# Patient Record
Sex: Female | Born: 1985 | Race: White | Hispanic: No | Marital: Married | Smoking: Current every day smoker
Health system: Southern US, Community
[De-identification: ages and names within clinical notes are randomized; demographics above are authoritative.]

## PROBLEM LIST (undated history)

## (undated) DIAGNOSIS — F200 Paranoid schizophrenia: Secondary | ICD-10-CM

## (undated) DIAGNOSIS — F329 Major depressive disorder, single episode, unspecified: Secondary | ICD-10-CM

## (undated) DIAGNOSIS — R011 Cardiac murmur, unspecified: Secondary | ICD-10-CM

## (undated) DIAGNOSIS — F319 Bipolar disorder, unspecified: Secondary | ICD-10-CM

## (undated) DIAGNOSIS — F419 Anxiety disorder, unspecified: Secondary | ICD-10-CM

## (undated) DIAGNOSIS — L0591 Pilonidal cyst without abscess: Secondary | ICD-10-CM

## (undated) DIAGNOSIS — F32A Depression, unspecified: Secondary | ICD-10-CM

## (undated) HISTORY — DX: Bipolar disorder, unspecified: F31.9

## (undated) HISTORY — DX: Depression, unspecified: F32.A

## (undated) HISTORY — DX: Major depressive disorder, single episode, unspecified: F32.9

## (undated) HISTORY — DX: Paranoid schizophrenia: F20.0

## (undated) HISTORY — DX: Anxiety disorder, unspecified: F41.9

## (undated) HISTORY — DX: Cardiac murmur, unspecified: R01.1

---

## 2004-01-30 ENCOUNTER — Other Ambulatory Visit: Payer: Self-pay

## 2005-11-21 ENCOUNTER — Emergency Department: Payer: Self-pay | Admitting: Emergency Medicine

## 2005-12-09 ENCOUNTER — Emergency Department: Payer: Self-pay | Admitting: Internal Medicine

## 2005-12-13 ENCOUNTER — Emergency Department: Payer: Self-pay | Admitting: Unknown Physician Specialty

## 2006-04-14 ENCOUNTER — Observation Stay: Payer: Self-pay | Admitting: Obstetrics & Gynecology

## 2006-08-28 ENCOUNTER — Emergency Department: Payer: Self-pay | Admitting: General Practice

## 2007-06-21 ENCOUNTER — Emergency Department: Payer: Self-pay | Admitting: Emergency Medicine

## 2011-11-08 ENCOUNTER — Emergency Department: Payer: Self-pay | Admitting: Emergency Medicine

## 2011-11-15 ENCOUNTER — Emergency Department: Payer: Self-pay | Admitting: Emergency Medicine

## 2011-11-15 LAB — HCG, QUANTITATIVE, PREGNANCY: Beta Hcg, Quant.: <1 m[IU]/mL — ABNORMAL LOW

## 2011-11-15 LAB — CBC
HGB: 13.3 g/dL (ref 12.0–16.0)
MCH: 29.8 pg (ref 26.0–34.0)
MCV: 88 fL (ref 80–100)
Platelet: 299 10*3/uL (ref 150–440)
RBC: 4.45 10*6/uL (ref 3.80–5.20)
WBC: 10 10*3/uL (ref 3.6–11.0)

## 2011-11-15 LAB — COMPREHENSIVE METABOLIC PANEL
Albumin: 3.5 g/dL (ref 3.4–5.0)
Anion Gap: 10 (ref 7–16)
BUN: 11 mg/dL (ref 7–18)
Bilirubin,Total: 0.2 mg/dL (ref 0.2–1.0)
Chloride: 103 mmol/L (ref 98–107)
Creatinine: 0.77 mg/dL (ref 0.60–1.30)
Glucose: 91 mg/dL (ref 65–99)
Osmolality: 280 (ref 275–301)
Potassium: 3.8 mmol/L (ref 3.5–5.1)
SGOT(AST): 28 U/L (ref 15–37)
SGPT (ALT): 22 U/L
Total Protein: 8.1 g/dL (ref 6.4–8.2)

## 2011-11-17 ENCOUNTER — Emergency Department: Payer: Self-pay | Admitting: Emergency Medicine

## 2012-06-30 ENCOUNTER — Emergency Department: Payer: Self-pay | Admitting: Unknown Physician Specialty

## 2012-06-30 LAB — URINALYSIS, COMPLETE
Bacteria: NONE SEEN
Bilirubin,UR: NEGATIVE
Glucose,UR: NEGATIVE mg/dL (ref 0–75)
Nitrite: NEGATIVE
Protein: NEGATIVE

## 2012-06-30 LAB — WET PREP, GENITAL

## 2012-11-11 ENCOUNTER — Emergency Department: Payer: Self-pay | Admitting: Emergency Medicine

## 2012-11-11 LAB — COMPREHENSIVE METABOLIC PANEL
Albumin: 3.3 g/dL — ABNORMAL LOW (ref 3.4–5.0)
Alkaline Phosphatase: 76 U/L (ref 50–136)
Bilirubin,Total: 0.5 mg/dL (ref 0.2–1.0)
Calcium, Total: 8.8 mg/dL (ref 8.5–10.1)
Chloride: 103 mmol/L (ref 98–107)
Co2: 29 mmol/L (ref 21–32)
Creatinine: 0.76 mg/dL (ref 0.60–1.30)
EGFR (African American): >60
Glucose: 88 mg/dL (ref 65–99)
Osmolality: 272 (ref 275–301)
Potassium: 3.6 mmol/L (ref 3.5–5.1)
SGPT (ALT): 29 U/L (ref 12–78)
Total Protein: 8.3 g/dL — ABNORMAL HIGH (ref 6.4–8.2)

## 2012-11-11 LAB — CBC WITH DIFFERENTIAL/PLATELET
Eosinophil %: 0.2 %
HCT: 40 % (ref 35.0–47.0)
HGB: 13.4 g/dL (ref 12.0–16.0)
Lymphocyte #: 2.5 10*3/uL (ref 1.0–3.6)
Lymphocyte %: 16.5 %
MCH: 29.4 pg (ref 26.0–34.0)
MCHC: 33.6 g/dL (ref 32.0–36.0)
Monocyte %: 6.4 %
Neutrophil #: 11.6 10*3/uL — ABNORMAL HIGH (ref 1.4–6.5)
Neutrophil %: 75.9 %
Platelet: 314 10*3/uL (ref 150–440)
RBC: 4.57 10*6/uL (ref 3.80–5.20)
RDW: 13.6 % (ref 11.5–14.5)
WBC: 15.4 10*3/uL — ABNORMAL HIGH (ref 3.6–11.0)

## 2012-11-11 LAB — LIPASE, BLOOD: Lipase: 121 U/L (ref 73–393)

## 2012-11-15 LAB — WOUND CULTURE

## 2013-03-16 ENCOUNTER — Emergency Department: Payer: Self-pay | Admitting: Emergency Medicine

## 2013-03-17 LAB — URINALYSIS, COMPLETE
Bilirubin,UR: NEGATIVE
Blood: NEGATIVE
Ketone: NEGATIVE
Ph: 5 (ref 4.5–8.0)
Squamous Epithelial: 3

## 2013-04-28 ENCOUNTER — Emergency Department: Payer: Self-pay | Admitting: Emergency Medicine

## 2014-02-20 ENCOUNTER — Emergency Department: Payer: Self-pay | Admitting: Emergency Medicine

## 2014-03-14 ENCOUNTER — Emergency Department: Payer: Self-pay | Admitting: Emergency Medicine

## 2014-12-28 ENCOUNTER — Emergency Department
Admit: 2014-12-28 | Disposition: A | Discharge status: Home / Self Care | Payer: Self-pay | Admitting: Emergency Medicine

## 2015-02-11 ENCOUNTER — Emergency Department
Admission: EM | Admit: 2015-02-11 | Discharge: 2015-02-11 | Disposition: A | Discharge status: Home / Self Care | Payer: Self-pay | Attending: Emergency Medicine | Admitting: Emergency Medicine

## 2015-02-11 ENCOUNTER — Encounter: Payer: Self-pay | Admitting: Emergency Medicine

## 2015-02-11 DIAGNOSIS — H0013 Chalazion right eye, unspecified eyelid: Secondary | ICD-10-CM

## 2015-02-11 DIAGNOSIS — H0011 Chalazion right upper eyelid: Secondary | ICD-10-CM | POA: Insufficient documentation

## 2015-02-11 DIAGNOSIS — Z720 Tobacco use: Secondary | ICD-10-CM | POA: Insufficient documentation

## 2015-02-11 DIAGNOSIS — L0591 Pilonidal cyst without abscess: Secondary | ICD-10-CM | POA: Insufficient documentation

## 2015-02-11 MED ORDER — HYDROCODONE-ACETAMINOPHEN 5-325 MG PO TABS: 1.0000 | ORAL_TABLET | ORAL | Status: DC | PRN

## 2015-02-11 MED ORDER — IBUPROFEN 800 MG PO TABS: 800.0000 mg | ORAL_TABLET | Freq: Three times a day (TID) | ORAL | Status: DC | PRN

## 2015-02-11 MED ORDER — SULFAMETHOXAZOLE-TRIMETHOPRIM 800-160 MG PO TABS: 1.0000 | ORAL_TABLET | Freq: Two times a day (BID) | ORAL | Status: DC

## 2015-02-11 NOTE — Discharge Instructions (Signed)
Chalazion A chalazion is a swelling or hard lump on the eyelid caused by a blocked oil gland. Chalazions may occur on the upper or the lower eyelid.  CAUSES  Oil gland in the eyelid becomes blocked. SYMPTOMS   Swelling or hard lump on the eyelid. This lump may make it hard to see out of the eye.  The swelling may spread to areas around the eye. TREATMENT   Although some chalazions disappear by themselves in 1 or 2 months, some chalazions may need to be removed.  Medicines to treat an infection may be required. HOME CARE INSTRUCTIONS   Wash your hands often and dry them with a clean towel. Do not touch the chalazion.  Apply heat to the eyelid several times a day for 10 minutes to help ease discomfort and bring any yellowish white fluid (pus) to the surface. One way to apply heat to a chalazion is to use the handle of a metal spoon.  Hold the handle under hot water until it is hot, and then wrap the handle in paper towels so that the heat can come through without burning your skin.  Hold the wrapped handle against the chalazion and reheat the spoon handle as needed.  Apply heat in this fashion for 10 minutes, 4 times per day.  Return to your caregiver to have the pus removed if it does not break (rupture) on its own.  Do not try to remove the pus yourself by squeezing the chalazion or sticking it with a pin or needle.  Only take over-the-counter or prescription medicines for pain, discomfort, or fever as directed by your caregiver. SEEK IMMEDIATE MEDICAL CARE IF:   You have pain in your eye.  Your vision changes.  The chalazion does not go away.  The chalazion becomes painful, red, or swollen, grows larger, or does not start to disappear after 2 weeks. MAKE SURE YOU:   Understand these instructions.  Will watch your condition.  Will get help right away if you are not doing well or get worse. Document Released: 08/14/2000 Document Revised: 11/09/2011 Document Reviewed:  12/02/2009 [facility] Patient Information 2015 [place], [place]. This information is not intended to replace advice given to you by your health care provider. Make sure you discuss any questions you have with your health care provider.  Pilonidal Cyst A pilonidal cyst occurs when hairs get trapped (ingrown) beneath the skin in the crease between the buttocks over your sacrum (the bone under that crease). Pilonidal cysts are most common in young men with a lot of body hair. When the cyst is ruptured (breaks) or leaking, fluid from the cyst may cause burning and itching. If the cyst becomes infected, it causes a painful swelling filled with pus (abscess). The pus and trapped hairs need to be removed (often by lancing) so that the infection can heal. However, recurrence is common and an operation may be needed to remove the cyst. HOME CARE INSTRUCTIONS   If the cyst was NOT INFECTED:  Keep the area clean and dry. Bathe or shower daily. Wash the area well with a germ-killing soap. Warm tub baths may help prevent infection and help with drainage. Dry the area well with a towel.  Avoid tight clothing to keep area as moisture free as possible.  Keep area between buttocks as free of hair as possible. A depilatory may be used.  If the cyst WAS INFECTED and needed to be drained:  Your caregiver packed the wound with gauze to keep the  wound open. This allows the wound to heal from the inside outwards and continue draining.  Return for a wound check in 1 day or as suggested.  If you take tub baths or showers, repack the wound with gauze following them. Sponge baths (at the sink) are a good alternative.  If an antibiotic was ordered to fight the infection, take as directed.  Only take over-the-counter or prescription medicines for pain, discomfort, or fever as directed by your caregiver.  After the drain is removed, use sitz baths for 20 minutes 4 times per day. Clean the wound gently with mild unscented  soap, pat dry, and then apply a dry dressing. SEEK MEDICAL CARE IF:   You have increased pain, swelling, redness, drainage, or bleeding from the area.  You have a fever.  You have muscles aches, dizziness, or a general ill feeling. Document Released: 08/14/2000 Document Revised: 11/09/2011 Document Reviewed: 10/12/2008 [facility] Patient Information 2015 [place], [place]. This information is not intended to replace advice given to you by your health care provider. Make sure you discuss any questions you have with your health care provider.

## 2015-02-11 NOTE — ED Provider Notes (Signed)
[facility] Emergency Department Provider Note  ____________________________________________  Time seen: Approximately 3:56 PM  I have reviewed the triage vital signs and the nursing notes.   HISTORY  Chief Complaint Tailbone Pain    HPI Cendy Sagel is a 29 y.o. female who presents for evaluation of a pilonidal cyst. Reports history of the same since 2007 resulting in multiple I&D's ofsame. Onset about 4 days ago complaining of swollen tenderness, 10 over 10 pain. In addition, patient complains of having a "lump" on her right eyelid. No pain reported.   Past Medical History  Diagnosis Date  . Cyst near tailbone     There are no active problems to display for this patient.   Past Surgical History  Procedure Laterality Date  . Cesarean section      Current Outpatient Rx  Name  Route  Sig  Dispense  Refill  . HYDROcodone-acetaminophen (NORCO) 5-325 MG per tablet   Oral   Take 1-2 tablets by mouth every 4 (four) hours as needed for moderate pain.   15 tablet   0   . ibuprofen (ADVIL,MOTRIN) 800 MG tablet   Oral   Take 1 tablet (800 mg total) by mouth every 8 (eight) hours as needed.   30 tablet   0   . sulfamethoxazole-trimethoprim (BACTRIM DS,SEPTRA DS) 800-160 MG per tablet   Oral   Take 1 tablet by mouth 2 (two) times daily.   20 tablet   0     Allergies Other  No family history on file.  Social History History  Substance Use Topics  . Smoking status: Current Every Day Smoker -- 0.50 packs/day    Types: Cigarettes  . Smokeless tobacco: Not on file  . Alcohol Use: No    Review of Systems Constitutional: No fever/chills Eyes: No visual changes. Lump on right eye ENT: No sore throat. Cardiovascular: Denies chest pain. Respiratory: Denies shortness of breath. Gastrointestinal: No abdominal pain.  No nausea, no vomiting.  No diarrhea.  No constipation. Genitourinary: Negative for dysuria. Musculoskeletal: Negative for  back pain. Skin: Negative for rash. Coccyx area palpable 1 cm nodule subcutaneously. No erythema no bogginess nothing to indicate an I&D. Neurological: Negative for headaches, focal weakness or numbness.  10-point ROS otherwise negative.  ____________________________________________   PHYSICAL EXAM:  VITAL SIGNS: ED Triage Vitals  Enc Vitals Group     BP 02/11/15 1446 109/60 mmHg     Pulse Rate 02/11/15 1446 57     Resp --      Temp 02/11/15 1446 98.1 F (36.7 C)     Temp Source 02/11/15 1446 Oral     SpO2 02/11/15 1446 99 %     Weight 02/11/15 1446 170 lb (77.111 kg)     Height 02/11/15 1446 5\' 4"  (1.626 m)     Head Cir --      Peak Flow --      Pain Score 02/11/15 1446 9     Pain Loc --      Pain Edu? --      Excl. in GC? --     Constitutional: Alert and oriented. Well appearing and in no acute distress. Eyes: Conjunctivae are normal. PERRL. EOMI. firm 2 mm nodule right upper eyelid.  Musculoskeletal: No lower extremity tenderness nor edema.  No joint effusions. Neurologic:  Normal speech and language. No gross focal neurologic deficits are appreciated. Speech is normal. No gait instability. Skin:  Skin is warm, dry and intact. No rash noted.Coccyx  area palpable 1 cm nodule subcutaneously. No erythema no bogginess nothing to indicate an I&D. Psychiatric: Mood and affect are normal. Speech and behavior are normal.  ____________________________________________   LABS (all labs ordered are listed, but only abnormal results are displayed)  Labs Reviewed - No data to display ____________________________________________  EKG  Deferred ____________________________________________  RADIOLOGY  Deferred ____________________________________________   PROCEDURES  Procedure(s) performed: None  Critical Care performed: No  ____________________________________________   INITIAL IMPRESSION / ASSESSMENT AND PLAN / ED COURSE  Pertinent labs & imaging results that  were available during my care of the patient were reviewed by me and considered in my medical decision making (see chart for details).  Diagnosed with Chiles in right eye, and pilonidal cyst. Rx given for Bactrim DS twice a day will treat 100 mg and hydrocodone as needed for pain. Compresses to the right eye with pain instructions given. Return follow-up if symptoms worsen. Patient voices no other emergency medical complaints at this visit ____________________________________________   FINAL CLINICAL IMPRESSION(S) / ED DIAGNOSES  Final diagnoses:  Chalazion of right eye  Pilonidal cyst without infection      Evangeline Dakin, PA-C 02/11/15 1630  Myrna Blazer, MD 02/12/15 1421

## 2015-02-11 NOTE — ED Notes (Signed)
States has had it lanced in past

## 2015-03-24 ENCOUNTER — Emergency Department: Payer: Self-pay

## 2015-03-24 ENCOUNTER — Emergency Department
Admission: EM | Admit: 2015-03-24 | Discharge: 2015-03-24 | Disposition: A | Discharge status: Home / Self Care | Payer: Self-pay | Attending: Emergency Medicine | Admitting: Emergency Medicine

## 2015-03-24 ENCOUNTER — Encounter: Payer: Self-pay | Admitting: Emergency Medicine

## 2015-03-24 DIAGNOSIS — Z792 Long term (current) use of antibiotics: Secondary | ICD-10-CM | POA: Insufficient documentation

## 2015-03-24 DIAGNOSIS — Y998 Other external cause status: Secondary | ICD-10-CM | POA: Insufficient documentation

## 2015-03-24 DIAGNOSIS — Z3202 Encounter for pregnancy test, result negative: Secondary | ICD-10-CM | POA: Insufficient documentation

## 2015-03-24 DIAGNOSIS — R1032 Left lower quadrant pain: Secondary | ICD-10-CM

## 2015-03-24 DIAGNOSIS — Z720 Tobacco use: Secondary | ICD-10-CM | POA: Insufficient documentation

## 2015-03-24 DIAGNOSIS — Y9289 Other specified places as the place of occurrence of the external cause: Secondary | ICD-10-CM | POA: Insufficient documentation

## 2015-03-24 DIAGNOSIS — S01112A Laceration without foreign body of left eyelid and periocular area, initial encounter: Secondary | ICD-10-CM | POA: Insufficient documentation

## 2015-03-24 DIAGNOSIS — Y9389 Activity, other specified: Secondary | ICD-10-CM | POA: Insufficient documentation

## 2015-03-24 DIAGNOSIS — S3991XA Unspecified injury of abdomen, initial encounter: Secondary | ICD-10-CM | POA: Insufficient documentation

## 2015-03-24 HISTORY — DX: Pilonidal cyst without abscess: L05.91

## 2015-03-24 LAB — BASIC METABOLIC PANEL
Anion gap: 8 (ref 5–15)
BUN: 10 mg/dL (ref 6–20)
CALCIUM: 8.2 mg/dL — AB (ref 8.9–10.3)
CO2: 23 mmol/L (ref 22–32)
Chloride: 108 mmol/L (ref 101–111)
Creatinine, Ser: 0.99 mg/dL (ref 0.44–1.00)
GFR calc Af Amer: >60 mL/min (ref >60)
GFR calc non Af Amer: >60 mL/min (ref >60)
GLUCOSE: 87 mg/dL (ref 65–99)
Potassium: 3.5 mmol/L (ref 3.5–5.1)
Sodium: 139 mmol/L (ref 135–145)

## 2015-03-24 LAB — URINALYSIS COMPLETE WITH MICROSCOPIC (ARMC ONLY)
Bilirubin Urine: NEGATIVE
Glucose, UA: NEGATIVE mg/dL
HGB URINE DIPSTICK: NEGATIVE
NITRITE: NEGATIVE
PROTEIN: 30 mg/dL — AB
Specific Gravity, Urine: 1.025 (ref 1.005–1.030)
pH: 5 (ref 5.0–8.0)

## 2015-03-24 LAB — CBC
HCT: 42.6 % (ref 35.0–47.0)
Hemoglobin: 14 g/dL (ref 12.0–16.0)
MCH: 29.2 pg (ref 26.0–34.0)
MCHC: 32.9 g/dL (ref 32.0–36.0)
MCV: 88.8 fL (ref 80.0–100.0)
PLATELETS: 310 10*3/uL (ref 150–440)
RBC: 4.8 MIL/uL (ref 3.80–5.20)
RDW: 14.1 % (ref 11.5–14.5)
WBC: 8.5 10*3/uL (ref 3.6–11.0)

## 2015-03-24 LAB — POCT PREGNANCY, URINE: PREG TEST UR: NEGATIVE

## 2015-03-24 MED ORDER — TRAMADOL HCL 50 MG PO TABS: 50.0000 mg | ORAL_TABLET | Freq: Four times a day (QID) | ORAL | Status: DC | PRN

## 2015-03-24 MED ORDER — IOHEXOL 300 MG/ML  SOLN
100.0000 mL | Freq: Once | INTRAMUSCULAR | Status: AC | PRN
  Administered 2015-03-24: 100 mL via INTRAVENOUS

## 2015-03-24 MED ORDER — TRAMADOL HCL 50 MG PO TABS
50.0000 mg | ORAL_TABLET | Freq: Once | ORAL | Status: AC
Start: 2015-03-24 — End: 2015-03-24
  Administered 2015-03-24: 50 mg via ORAL
  Filled 2015-03-24: qty 1
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
## 2015-05-13 ENCOUNTER — Other Ambulatory Visit: Payer: Self-pay

## 2015-05-13 ENCOUNTER — Emergency Department
Admission: EM | Admit: 2015-05-13 | Discharge: 2015-05-13 | Disposition: A | Discharge status: Home / Self Care | Payer: Self-pay | Attending: Emergency Medicine | Admitting: Emergency Medicine

## 2015-05-13 ENCOUNTER — Encounter: Payer: Self-pay | Admitting: Emergency Medicine

## 2015-05-13 ENCOUNTER — Emergency Department: Payer: Self-pay

## 2015-05-13 DIAGNOSIS — R1010 Upper abdominal pain, unspecified: Secondary | ICD-10-CM | POA: Insufficient documentation

## 2015-05-13 DIAGNOSIS — Z720 Tobacco use: Secondary | ICD-10-CM | POA: Insufficient documentation

## 2015-05-13 DIAGNOSIS — R079 Chest pain, unspecified: Secondary | ICD-10-CM | POA: Insufficient documentation

## 2015-05-13 DIAGNOSIS — Z792 Long term (current) use of antibiotics: Secondary | ICD-10-CM | POA: Insufficient documentation

## 2015-05-13 LAB — COMPREHENSIVE METABOLIC PANEL
ALBUMIN: 3.8 g/dL (ref 3.5–5.0)
ALK PHOS: 62 U/L (ref 38–126)
ALT: 13 U/L — ABNORMAL LOW (ref 14–54)
ANION GAP: 7 (ref 5–15)
AST: 24 U/L (ref 15–41)
BUN: 9 mg/dL (ref 6–20)
CO2: 26 mmol/L (ref 22–32)
Calcium: 8.9 mg/dL (ref 8.9–10.3)
Chloride: 98 mmol/L — ABNORMAL LOW (ref 101–111)
Creatinine, Ser: 0.71 mg/dL (ref 0.44–1.00)
GFR calc non Af Amer: >60 mL/min (ref >60)
GLUCOSE: 118 mg/dL — AB (ref 65–99)
POTASSIUM: 3.6 mmol/L (ref 3.5–5.1)
SODIUM: 131 mmol/L — AB (ref 135–145)
Total Bilirubin: 0.8 mg/dL (ref 0.3–1.2)
Total Protein: 8.2 g/dL — ABNORMAL HIGH (ref 6.5–8.1)

## 2015-05-13 LAB — CBC WITH DIFFERENTIAL/PLATELET
BASOS PCT: 0 %
Basophils Absolute: 0 10*3/uL (ref 0–0.1)
EOS PCT: 0 %
Eosinophils Absolute: 0 10*3/uL (ref 0–0.7)
HCT: 40.5 % (ref 35.0–47.0)
HEMOGLOBIN: 13.4 g/dL (ref 12.0–16.0)
LYMPHS ABS: 1 10*3/uL (ref 1.0–3.6)
Lymphocytes Relative: 10 %
MCH: 28.6 pg (ref 26.0–34.0)
MCHC: 33.1 g/dL (ref 32.0–36.0)
MCV: 86.5 fL (ref 80.0–100.0)
MONO ABS: 1 10*3/uL — AB (ref 0.2–0.9)
Monocytes Relative: 10 %
Neutro Abs: 8.3 10*3/uL — ABNORMAL HIGH (ref 1.4–6.5)
Neutrophils Relative %: 80 %
PLATELETS: 293 10*3/uL (ref 150–440)
RBC: 4.68 MIL/uL (ref 3.80–5.20)
RDW: 14.6 % — AB (ref 11.5–14.5)
WBC: 10.4 10*3/uL (ref 3.6–11.0)

## 2015-05-13 LAB — URINALYSIS COMPLETE WITH MICROSCOPIC (ARMC ONLY)
Bacteria, UA: NONE SEEN
Bilirubin Urine: NEGATIVE
Glucose, UA: NEGATIVE mg/dL
KETONES UR: NEGATIVE mg/dL
LEUKOCYTES UA: NEGATIVE
NITRITE: NEGATIVE
Protein, ur: NEGATIVE mg/dL
Specific Gravity, Urine: 1.003 — ABNORMAL LOW (ref 1.005–1.030)
pH: 6 (ref 5.0–8.0)

## 2015-05-13 LAB — LIPASE, BLOOD: Lipase: 14 U/L — ABNORMAL LOW (ref 22–51)

## 2015-05-13 MED ORDER — KETOROLAC TROMETHAMINE 30 MG/ML IJ SOLN
30.0000 mg | Freq: Once | INTRAMUSCULAR | Status: AC
  Administered 2015-05-13: 30 mg via INTRAVENOUS

## 2015-05-13 MED ORDER — ACETAMINOPHEN 325 MG PO TABS
ORAL_TABLET | ORAL | Status: AC
  Filled 2015-05-13: qty 2

## 2015-05-13 MED ORDER — CYCLOBENZAPRINE HCL 10 MG PO TABS: 10.0000 mg | ORAL_TABLET | Freq: Three times a day (TID) | ORAL | Status: DC | PRN

## 2015-05-13 MED ORDER — CYCLOBENZAPRINE HCL 10 MG PO TABS
10.0000 mg | ORAL_TABLET | Freq: Once | ORAL | Status: AC
  Administered 2015-05-13: 10 mg via ORAL

## 2015-05-13 MED ORDER — KETOROLAC TROMETHAMINE 30 MG/ML IJ SOLN
INTRAMUSCULAR | Status: AC
  Administered 2015-05-13: 30 mg via INTRAVENOUS
  Filled 2015-05-13: qty 1

## 2015-05-13 MED ORDER — CYCLOBENZAPRINE HCL 10 MG PO TABS
ORAL_TABLET | ORAL | Status: AC
  Administered 2015-05-13: 10 mg via ORAL
  Filled 2015-05-13: qty 1

## 2015-05-13 MED ORDER — ACETAMINOPHEN 325 MG PO TABS
650.0000 mg | ORAL_TABLET | Freq: Once | ORAL | Status: AC
  Administered 2015-05-13: 650 mg via ORAL

## 2015-05-13 NOTE — ED Notes (Signed)
Pt states left sided middle chest pain radiating to her rib cage and back, pt states throbbing pain, pt is a smoker, pt states "I feel like its my lungs"

## 2015-05-13 NOTE — ED Notes (Signed)
Pt here by EMS with epigastric pain and chest pain. Pt states that she has been having pain since yesterday at 11am.  Pt states that a hot shower helped this. Pt denies any n/v or injury. Pt in wheelchair during triage. Pt in NAD at this time.

## 2015-05-13 NOTE — Discharge Instructions (Signed)

## 2015-05-13 NOTE — ED Provider Notes (Signed)
[facility] Emergency Department Provider Note  ____________________________________________  Time seen: Approximately 215 PM  I have reviewed the triage vital signs and the nursing notes.   HISTORY  Chief Complaint Abdominal Pain    HPI Carly Drake is a 29 y.o. female with a history of 2 previous episodes of chest pain with hospital visitation who is presenting today with chest and upper abdominal pain over the past day. The patient says that it feels like a squeezing quality and lasts for about 5 minutes. She denies any radiation, despite the nursing note, but does say that the pain is simultaneous in the left side of her back, center and left chest as well as the left side of her neck. She says that the pain also causes some shortness of breath. She denies any nausea, vomiting. There is no diaphoresis. She denies any heavy lifting or injury that could've caused this. She says that she has had this same issue twice in the past and has been evaluated at the hospital and there has been a musculoskeletal cause diagnosed. She has not taken any medication for this. She says that in the past that she was given "a shot" which helped relieve her pain. She denies any exogenous hormones. She does not take any birth control or have an IUD. She has no history of blood clots. She does smoke. No recent surgery or trauma. York Spaniel that she did have a cough this past Saturday.Pain does increase with deep breathing.   Past Medical History  Diagnosis Date  . Pilonidal cyst     There are no active problems to display for this patient.   Past Surgical History  Procedure Laterality Date  . Cesarean section      Current Outpatient Rx  Name  Route  Sig  Dispense  Refill  . HYDROcodone-acetaminophen (NORCO) 5-325 MG per tablet   Oral   Take 1-2 tablets by mouth every 4 (four) hours as needed for moderate pain.   15 tablet   0   . ibuprofen (ADVIL,MOTRIN) 800 MG tablet    Oral   Take 1 tablet (800 mg total) by mouth every 8 (eight) hours as needed.   30 tablet   0   . sulfamethoxazole-trimethoprim (BACTRIM DS,SEPTRA DS) 800-160 MG per tablet   Oral   Take 1 tablet by mouth 2 (two) times daily.   20 tablet   0   . traMADol (ULTRAM) 50 MG tablet   Oral   Take 1 tablet (50 mg total) by mouth every 6 (six) hours as needed.   12 tablet   0     Allergies Clindamycin/lincomycin  No family history on file.  Social History Social History  Substance Use Topics  . Smoking status: Current Every Day Smoker -- 0.50 packs/day    Types: Cigarettes  . Smokeless tobacco: Current User  . Alcohol Use: Yes    Review of Systems Constitutional: No fever/chills Eyes: No visual changes. ENT: No sore throat. Cardiovascular: As above  Respiratory: As above  Gastrointestinal:  No nausea, no vomiting.  No diarrhea.  No constipation. Genitourinary: Negative for dysuria. Musculoskeletal: As above  Skin: Negative for rash. Neurological: Negative for headaches, focal weakness or numbness.  10-point ROS otherwise negative.  ____________________________________________   PHYSICAL EXAM:  VITAL SIGNS: ED Triage Vitals  Enc Vitals Group     BP 05/13/15 1311 111/65 mmHg     Pulse Rate 05/13/15 1311 75     Resp 05/13/15 1311 18  Temp 05/13/15 1311 98.4 F (36.9 C)     Temp Source 05/13/15 1311 Oral     SpO2 05/13/15 1311 100 %     Weight 05/13/15 1311 186 lb (84.369 kg)     Height --      Head Cir --      Peak Flow --      Pain Score --      Pain Loc --      Pain Edu? --      Excl. in GC? --     Constitutional: Alert and oriented. Well appearing and in no acute distress. Eyes: Conjunctivae are normal. PERRL. EOMI. Head: Atraumatic. Nose: No congestion/rhinnorhea. Mouth/Throat: Mucous membranes are moist.  Oropharynx non-erythematous. Neck: No stridor.   Cardiovascular: Normal rate, regular rhythm. Grossly normal heart sounds.  Good peripheral  circulation. Chest pain is not reproducible to palpation. Respiratory: Normal respiratory effort.  No retractions. Lungs CTAB. There is tenderness over the left ribs 8 through 10. There is no crepitus or deformity. Gastrointestinal: The belly is soft and there is no abdominal tenderness. No distention. No abdominal bruits. No CVA tenderness. Musculoskeletal: No lower extremity tenderness nor edema.  No joint effusions. Neurologic:  Normal speech and language. No gross focal neurologic deficits are appreciated. No gait instability. Skin:  Skin is warm, dry and intact. No rash noted. Psychiatric: Mood and affect are normal. Speech and behavior are normal.  ____________________________________________   LABS (all labs ordered are listed, but only abnormal results are displayed)  Labs Reviewed  CBC WITH DIFFERENTIAL/PLATELET - Abnormal; Notable for the following:    RDW 14.6 (*)    Neutro Abs 8.3 (*)    Monocytes Absolute 1.0 (*)    All other components within normal limits  COMPREHENSIVE METABOLIC PANEL - Abnormal; Notable for the following:    Sodium 131 (*)    Chloride 98 (*)    Glucose, Bld 118 (*)    Total Protein 8.2 (*)    ALT 13 (*)    All other components within normal limits  LIPASE, BLOOD - Abnormal; Notable for the following:    Lipase 14 (*)    All other components within normal limits  URINALYSIS COMPLETEWITH MICROSCOPIC ([facility] ONLY) - Abnormal; Notable for the following:    Color, Urine STRAW (*)    APPearance CLEAR (*)    Specific Gravity, Urine 1.003 (*)    Hgb urine dipstick 1+ (*)    Squamous Epithelial / LPF 0-5 (*)    All other components within normal limits   ____________________________________________  EKG  ED ECG REPORT I, Arelia Longest, the attending physician, personally viewed and interpreted this ECG.   Date: 05/13/2015  EKG Time: 1320  Rate: 68  Rhythm: normal EKG, normal sinus rhythm  Axis: Normal axis  Intervals:none  ST&T Change:  No ST segment elevation or depression. No abnormal T-wave inversion.  ____________________________________________  RADIOLOGY  No active cardiopulmonary disease. I personally reviewed the symptoms. ____________________________________________   PROCEDURES    ____________________________________________   INITIAL IMPRESSION / ASSESSMENT AND PLAN / ED COURSE  Pertinent labs & imaging results that were available during my care of the patient were reviewed by me and considered in my medical decision making (see chart for details).  ----------------------------------------- 3:14 PM on 05/13/2015 -----------------------------------------  Patient resting comfortable at this time and the pain is improved after Flexeril and Toradol. I will discharge her on Flexeril. She knows that she may also use an anti-inflammatory such as  ibuprofen or Aleve. She will also use a muscle cream such as icy hot or BenGay. I feel that this is a musculoskeletal issue. She has a normal EKG. She is alsoPERC negative.   ____________________________________________   FINAL CLINICAL IMPRESSION(S) / ED DIAGNOSES  Acute chest pain. Initial visit.    Myrna Blazer, MD 05/13/15 1515

## 2015-05-20 ENCOUNTER — Emergency Department
Admission: EM | Admit: 2015-05-20 | Discharge: 2015-05-20 | Disposition: A | Discharge status: Home / Self Care | Payer: Self-pay | Attending: Emergency Medicine | Admitting: Emergency Medicine

## 2015-05-20 ENCOUNTER — Encounter: Payer: Self-pay | Admitting: Emergency Medicine

## 2015-05-20 DIAGNOSIS — L0501 Pilonidal cyst with abscess: Secondary | ICD-10-CM | POA: Insufficient documentation

## 2015-05-20 DIAGNOSIS — Z720 Tobacco use: Secondary | ICD-10-CM | POA: Insufficient documentation

## 2015-05-20 MED ORDER — SULFAMETHOXAZOLE-TRIMETHOPRIM 800-160 MG PO TABS: 1.0000 | ORAL_TABLET | Freq: Two times a day (BID) | ORAL | Status: DC

## 2015-05-20 MED ORDER — HYDROCODONE-ACETAMINOPHEN 5-325 MG PO TABS: 1.0000 | ORAL_TABLET | Freq: Four times a day (QID) | ORAL | Status: DC | PRN

## 2015-05-20 MED ORDER — SULFAMETHOXAZOLE-TRIMETHOPRIM 800-160 MG PO TABS
1.0000 | ORAL_TABLET | Freq: Once | ORAL | Status: AC
  Administered 2015-05-20: 1 via ORAL
  Filled 2015-05-20: qty 1

## 2015-05-20 MED ORDER — OXYCODONE HCL 5 MG PO TABS
5.0000 mg | ORAL_TABLET | Freq: Once | ORAL | Status: AC
  Administered 2015-05-20: 5 mg via ORAL
  Filled 2015-05-20: qty 1

## 2015-05-20 MED ORDER — LIDOCAINE-EPINEPHRINE (PF) 1 %-1:200000 IJ SOLN
30.0000 mL | Freq: Once | INTRAMUSCULAR | Status: AC
  Administered 2015-05-20: 30 mL
  Filled 2015-05-20: qty 30

## 2015-05-20 NOTE — ED Notes (Signed)
Pt presents to ED via EMS from home with c/o abscess to right buttock. Pt reports that she has had abscesses frequently since 2007. Pt reports that current abscess has been developing for several days.  Pt reports that she has been told she needs to have "pylonidal cyst" removed but cannot pay for surgery. Pt is awake and alert at this time.

## 2015-05-20 NOTE — ED Provider Notes (Signed)
[facility] Emergency Department Provider Note ____________________________________________  Time seen: Approximately 3:16 PM  I have reviewed the triage vital signs and the nursing notes.   HISTORY  Chief Complaint Abscess   HPI Carly Drake is a 29 y.o. female who presents for evaluation of a pilonidal cyst. She has had recurrent episodes of pain and has had to have it lanced multiple times. Pain and swelling has gotten much worse over the last 2 days. No fever. No nausea or vomiting.   Past Medical History  Diagnosis Date  . Pilonidal cyst     There are no active problems to display for this patient.   Past Surgical History  Procedure Laterality Date  . Cesarean section      Current Outpatient Rx  Name  Route  Sig  Dispense  Refill  . cyclobenzaprine (FLEXERIL) 10 MG tablet   Oral   Take 1 tablet (10 mg total) by mouth 3 (three) times daily as needed for muscle spasms.   15 tablet   1   . HYDROcodone-acetaminophen (NORCO/VICODIN) 5-325 MG per tablet   Oral   Take 1 tablet by mouth every 6 (six) hours as needed for moderate pain.   9 tablet   0   . ibuprofen (ADVIL,MOTRIN) 800 MG tablet   Oral   Take 1 tablet (800 mg total) by mouth every 8 (eight) hours as needed.   30 tablet   0   . sulfamethoxazole-trimethoprim (BACTRIM DS,SEPTRA DS) 800-160 MG per tablet   Oral   Take 1 tablet by mouth 2 (two) times daily.   20 tablet   0   . traMADol (ULTRAM) 50 MG tablet   Oral   Take 1 tablet (50 mg total) by mouth every 6 (six) hours as needed.   12 tablet   0     Allergies Clindamycin/lincomycin  History reviewed. No pertinent family history.  Social History Social History  Substance Use Topics  . Smoking status: Current Every Day Smoker -- 1.00 packs/day    Types: Cigarettes  . Smokeless tobacco: Current User  . Alcohol Use: Yes    Review of Systems   Constitutional: No fever/chills Eyes: No visual changes. ENT: No  congestion or rhinorrhea Cardiovascular: Denies chest pain. Respiratory: Denies shortness of breath. Gastrointestinal: No abdominal pain.  No nausea, no vomiting.  No diarrhea.  No constipation. Genitourinary: Negative for dysuria. Musculoskeletal: Negative for back pain. Skin: Pilonidal cyst Neurological: Negative for headaches, focal weakness or numbness.  10-point ROS otherwise negative.  ____________________________________________   PHYSICAL EXAM:  VITAL SIGNS: ED Triage Vitals  Enc Vitals Group     BP 05/20/15 1448 93/68 mmHg     Pulse Rate 05/20/15 1448 82     Resp 05/20/15 1448 16     Temp 05/20/15 1448 98.2 F (36.8 C)     Temp Source 05/20/15 1448 Oral     SpO2 05/20/15 1448 97 %     Weight 05/20/15 1448 180 lb (81.647 kg)     Height 05/20/15 1448  (1.626 m)     Head Cir --      Peak Flow --      Pain Score 05/20/15 1516 10     Pain Loc --      Pain Edu? --      Excl. in GC? --     Constitutional: Alert and oriented. Well appearing and in no acute distress. Eyes: Conjunctivae are normal. PERRL. EOMI. Head: Atraumatic. Nose: No  congestion/rhinnorhea. Mouth/Throat: Mucous membranes are moist.  Oropharynx non-erythematous. No oral lesions. Neck: No stridor. Cardiovascular: Normal rate, regular rhythm.  Good peripheral circulation. Respiratory: Normal respiratory effort.  No retractions. Gastrointestinal: Soft and nontender. No distention. No abdominal bruits.  Musculoskeletal: No lower extremity tenderness nor edema.  No joint effusions. Neurologic:  Normal speech and language. No gross focal neurologic deficits are appreciated. Speech is normal. No gait instability. Skin:  Raised, fluctuant area noted on left buttock with mild erythema surrounding; no perirectal induration or fluctuance; Negative for petechiae.  Psychiatric: Mood and affect are normal. Speech and behavior are normal.  ____________________________________________   LABS (all labs  ordered are listed, but only abnormal results are displayed)  Labs Reviewed - No data to display ____________________________________________  EKG   ____________________________________________  RADIOLOGY  Not indicated. ____________________________________________   PROCEDURES  Procedure(s) performed:  INCISION AND DRAINAGE Performed by: Kem Boroughs Consent: Verbal consent obtained. Risks and benefits: risks, benefits and alternatives were discussed Type: abscess  Body area: Nape of the bottocks  Anesthesia: local infiltration  Incision was made with a scalpel.  Local anesthetic: lidocaine 1% with epinephrine  Anesthetic total: 4 ml  Complexity: complex Blunt dissection to break up loculations  Drainage: purulent  Drainage amount: Copious   Packing material: 1/4 in iodoform gauze  Patient tolerance: Patient tolerated the procedure well with no immediate complications.    ____________________________________________   INITIAL IMPRESSION / ASSESSMENT AND PLAN / ED COURSE  Pertinent labs & imaging results that were available during my care of the patient were reviewed by me and considered in my medical decision making (see chart for details).  Patient was advised to remove the packing at home in 2 days as long as she is feeling better. She was advised to return to the emergency department for packing removal and possibly repacking if there is still significant amount of drainage, redness, or pain. He was strongly advised to schedule an appointment with a surgeon for excision of the pilonidal cyst. She was advised to return to the emergency department for symptoms that change or worsen if she is unable schedule an appointment. ____________________________________________   FINAL CLINICAL IMPRESSION(S) / ED DIAGNOSES  Final diagnoses:  Pilonidal cyst with abscess       Chinita Pester, FNP 05/20/15 1705  Jennye Moccasin, MD 05/20/15 1932

## 2016-02-10 ENCOUNTER — Encounter: Payer: Self-pay | Admitting: Emergency Medicine

## 2016-02-10 DIAGNOSIS — L03116 Cellulitis of left lower limb: Secondary | ICD-10-CM | POA: Insufficient documentation

## 2016-02-10 DIAGNOSIS — F1490 Cocaine use, unspecified, uncomplicated: Secondary | ICD-10-CM | POA: Insufficient documentation

## 2016-02-10 DIAGNOSIS — F329 Major depressive disorder, single episode, unspecified: Secondary | ICD-10-CM | POA: Insufficient documentation

## 2016-02-10 DIAGNOSIS — F17210 Nicotine dependence, cigarettes, uncomplicated: Secondary | ICD-10-CM | POA: Insufficient documentation

## 2016-02-10 DIAGNOSIS — F1910 Other psychoactive substance abuse, uncomplicated: Secondary | ICD-10-CM | POA: Insufficient documentation

## 2016-02-10 NOTE — ED Notes (Addendum)
Pt to triage in wheelchair with pain, swelling and redness to left foot for 3 days.  Pt reports burning, pressure and "needles" pain (10/10) with redness that started in two different places: top of foot lateral side, and medial ankle.  Pt reports both areas started small then continued to grow in redness and pain. Pt denies incident that started pain.    Pt able to move toes and circulation intact.

## 2016-02-11 ENCOUNTER — Encounter: Payer: Self-pay | Admitting: Psychiatry

## 2016-02-11 ENCOUNTER — Emergency Department
Admission: EM | Admit: 2016-02-11 | Discharge: 2016-02-11 | Disposition: A | Discharge status: Other Acute Inpatient Hospital | Payer: Self-pay | Attending: Emergency Medicine | Admitting: Emergency Medicine

## 2016-02-11 DIAGNOSIS — F332 Major depressive disorder, recurrent severe without psychotic features: Secondary | ICD-10-CM | POA: Diagnosis present

## 2016-02-11 DIAGNOSIS — F1994 Other psychoactive substance use, unspecified with psychoactive substance-induced mood disorder: Secondary | ICD-10-CM

## 2016-02-11 DIAGNOSIS — F1420 Cocaine dependence, uncomplicated: Secondary | ICD-10-CM | POA: Diagnosis present

## 2016-02-11 DIAGNOSIS — F1910 Other psychoactive substance abuse, uncomplicated: Secondary | ICD-10-CM

## 2016-02-11 DIAGNOSIS — F32A Depression, unspecified: Secondary | ICD-10-CM

## 2016-02-11 DIAGNOSIS — F1020 Alcohol dependence, uncomplicated: Secondary | ICD-10-CM | POA: Diagnosis present

## 2016-02-11 DIAGNOSIS — L03116 Cellulitis of left lower limb: Secondary | ICD-10-CM

## 2016-02-11 DIAGNOSIS — F329 Major depressive disorder, single episode, unspecified: Secondary | ICD-10-CM

## 2016-02-11 DIAGNOSIS — F17200 Nicotine dependence, unspecified, uncomplicated: Secondary | ICD-10-CM | POA: Diagnosis present

## 2016-02-11 DIAGNOSIS — L0390 Cellulitis, unspecified: Secondary | ICD-10-CM | POA: Diagnosis present

## 2016-02-11 DIAGNOSIS — F339 Major depressive disorder, recurrent, unspecified: Secondary | ICD-10-CM

## 2016-02-11 LAB — CBC WITH DIFFERENTIAL/PLATELET
BASOS ABS: 0.1 10*3/uL (ref 0–0.1)
BASOS PCT: 1 %
EOS ABS: 0.1 10*3/uL (ref 0–0.7)
EOS PCT: 1 %
HCT: 41 % (ref 35.0–47.0)
Hemoglobin: 14.1 g/dL (ref 12.0–16.0)
Lymphocytes Relative: 26 %
Lymphs Abs: 2 10*3/uL (ref 1.0–3.6)
MCH: 30.2 pg (ref 26.0–34.0)
MCHC: 34.4 g/dL (ref 32.0–36.0)
MCV: 87.8 fL (ref 80.0–100.0)
MONO ABS: 0.7 10*3/uL (ref 0.2–0.9)
Monocytes Relative: 9 %
Neutro Abs: 4.9 10*3/uL (ref 1.4–6.5)
Neutrophils Relative %: 63 %
PLATELETS: 259 10*3/uL (ref 150–440)
RBC: 4.66 MIL/uL (ref 3.80–5.20)
RDW: 15.2 % — AB (ref 11.5–14.5)
WBC: 7.8 10*3/uL (ref 3.6–11.0)

## 2016-02-11 LAB — URINE DRUG SCREEN, QUALITATIVE (ARMC ONLY)
Amphetamines, Ur Screen: NOT DETECTED
BARBITURATES, UR SCREEN: NOT DETECTED
Benzodiazepine, Ur Scrn: NOT DETECTED
CANNABINOID 50 NG, UR ~~LOC~~: NOT DETECTED
Cocaine Metabolite,Ur ~~LOC~~: POSITIVE — AB
MDMA (ECSTASY) UR SCREEN: NOT DETECTED
Methadone Scn, Ur: NOT DETECTED
Opiate, Ur Screen: NOT DETECTED
PHENCYCLIDINE (PCP) UR S: NOT DETECTED
TRICYCLIC, UR SCREEN: NOT DETECTED

## 2016-02-11 LAB — COMPREHENSIVE METABOLIC PANEL
ALBUMIN: 3.7 g/dL (ref 3.5–5.0)
ALT: 13 U/L — ABNORMAL LOW (ref 14–54)
AST: 22 U/L (ref 15–41)
Alkaline Phosphatase: 68 U/L (ref 38–126)
Anion gap: 8 (ref 5–15)
BUN: 9 mg/dL (ref 6–20)
CHLORIDE: 104 mmol/L (ref 101–111)
CO2: 25 mmol/L (ref 22–32)
Calcium: 8.8 mg/dL — ABNORMAL LOW (ref 8.9–10.3)
Creatinine, Ser: 0.64 mg/dL (ref 0.44–1.00)
GFR calc Af Amer: >60 mL/min (ref >60)
Glucose, Bld: 100 mg/dL — ABNORMAL HIGH (ref 65–99)
POTASSIUM: 3.2 mmol/L — AB (ref 3.5–5.1)
SODIUM: 137 mmol/L (ref 135–145)
Total Bilirubin: 0.3 mg/dL (ref 0.3–1.2)
Total Protein: 7.8 g/dL (ref 6.5–8.1)

## 2016-02-11 LAB — URINALYSIS COMPLETE WITH MICROSCOPIC (ARMC ONLY)
Bilirubin Urine: NEGATIVE
GLUCOSE, UA: NEGATIVE mg/dL
Hgb urine dipstick: NEGATIVE
KETONES UR: NEGATIVE mg/dL
Nitrite: NEGATIVE
PROTEIN: NEGATIVE mg/dL
Specific Gravity, Urine: 1.009 (ref 1.005–1.030)
pH: 8 (ref 5.0–8.0)

## 2016-02-11 LAB — SALICYLATE LEVEL

## 2016-02-11 LAB — POCT PREGNANCY, URINE: Preg Test, Ur: NEGATIVE

## 2016-02-11 LAB — ETHANOL: ALCOHOL ETHYL (B): 15 mg/dL — AB (ref <5)

## 2016-02-11 LAB — ACETAMINOPHEN LEVEL: Acetaminophen (Tylenol), Serum: <10 ug/mL — ABNORMAL LOW (ref 10–30)

## 2016-02-11 MED ORDER — SODIUM CHLORIDE 0.9 % IV BOLUS (SEPSIS)
1000.0000 mL | Freq: Once | INTRAVENOUS | Status: AC
  Administered 2016-02-11: 1000 mL via INTRAVENOUS

## 2016-02-11 MED ORDER — SULFAMETHOXAZOLE-TRIMETHOPRIM 800-160 MG PO TABS
2.0000 | ORAL_TABLET | Freq: Two times a day (BID) | ORAL | Status: DC
  Administered 2016-02-11 (×2): 2 via ORAL
  Filled 2016-02-11 (×2): qty 2

## 2016-02-11 MED ORDER — VITAMIN B-1 100 MG PO TABS
100.0000 mg | ORAL_TABLET | Freq: Every day | ORAL | Status: DC
  Administered 2016-02-11: 100 mg via ORAL
  Filled 2016-02-11: qty 1

## 2016-02-11 MED ORDER — POTASSIUM CHLORIDE CRYS ER 20 MEQ PO TBCR
40.0000 meq | EXTENDED_RELEASE_TABLET | Freq: Once | ORAL | Status: AC
  Administered 2016-02-11: 40 meq via ORAL
  Filled 2016-02-11: qty 2

## 2016-02-11 MED ORDER — VANCOMYCIN HCL IN DEXTROSE 1-5 GM/200ML-% IV SOLN
1000.0000 mg | Freq: Once | INTRAVENOUS | Status: AC
  Administered 2016-02-11: 1000 mg via INTRAVENOUS
  Filled 2016-02-11: qty 200

## 2016-02-11 MED ORDER — IBUPROFEN 600 MG PO TABS
600.0000 mg | ORAL_TABLET | ORAL | Status: DC | PRN
  Administered 2016-02-11: 600 mg via ORAL
  Filled 2016-02-11: qty 1

## 2016-02-11 MED ORDER — LORAZEPAM 2 MG PO TABS
0.0000 mg | ORAL_TABLET | Freq: Four times a day (QID) | ORAL | Status: DC
  Administered 2016-02-11 (×2): 1 mg via ORAL
  Filled 2016-02-11 (×2): qty 1

## 2016-02-11 MED ORDER — SULFAMETHOXAZOLE-TRIMETHOPRIM 800-160 MG PO TABS: 2.0000 | ORAL_TABLET | Freq: Two times a day (BID) | ORAL | Status: DC

## 2016-02-11 NOTE — ED Notes (Signed)
Pt will not attempt to walk due to pain of leg. Pt did walk to give urine sample but states that it causes her extreme pain in leg with cellulitis and will not attempt to walk for this RN. Pt informed that [facility] requests that pt be able to walk independently. Pt still will not attempt to ambulate.

## 2016-02-11 NOTE — BHH Counselor (Signed)
Pt has been accepted for placement at [facility]. Assigned to bed 304 Accepted by Dr. Toni Amendlapacs Attending:  Dr. Jennet MaduroPucilowska

## 2016-02-11 NOTE — BH Assessment (Signed)
Patient declined at [facility] due to her inability to ambulate.  [facility](Tina) no appropriate OBS beds for the patient.   Per Dr. Toni Amendlapacs - patient meets criteria for inpatient hospitalization. Writer informed the RN of the disposition

## 2016-02-11 NOTE — BH Assessment (Signed)
Writer will continue to try to reach the Charge Nurse Victorino DikeJennifer regarding placement for the patient in the Unit.

## 2016-02-11 NOTE — ED Notes (Signed)
BEHAVIORAL HEALTH ROUNDING Patient sleeping: No. Patient alert and oriented: yes Behavior appropriate: Yes.  ; If no, describe:  Nutrition and fluids offered: Yes  Toileting and hygiene offered: Yes  Sitter present: yes Law enforcement present: Yes  

## 2016-02-11 NOTE — ED Notes (Signed)
Psych at bedside.

## 2016-02-11 NOTE — Progress Notes (Signed)
[facility]. Patient asleep. Calm and cooperative.

## 2016-02-11 NOTE — ED Notes (Signed)
Psychiatrist at bedside
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
## 2016-02-11 NOTE — ED Notes (Signed)
Pt resting in bed, resp even and unlabored, pt in no distress 
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
## 2016-02-11 NOTE — BH Assessment (Signed)
Assessment Note  Carly Drake is an 30 y.o. female. Carly Drake reports to the ED by being transported by her neighbor. She reports that she is here due to pain in her foot.  She reports that she needs to speak with someone. She reports being in [facility] for 5 days. She reports feeling, "Nobody cares, so why should I be here".  She reports that she was put out today by her friend and the police were called on her. She is having relationship problems with her husband and states "Its over between me and him".  She reports symptoms of depression.  She states that she wants to cry constantly.  She denied symptoms of anxiety.  She denied auditory or visual hallucinations.  She reports having suicidal thoughts earlier, but not current. She denied suicidal intent.  She denied homicidal ideation or intent.  She reports drinking alcohol daily, as well as using crack cocaine daily.    Diagnosis: Depression  Past Medical History:  Past Medical History  Diagnosis Date  . Pilonidal cyst     Past Surgical History  Procedure Laterality Date  . Cesarean section      Family History: History reviewed. No pertinent family history.  Social History:  reports that she has been smoking Cigarettes.  She has been smoking about 1.00 pack per day. She uses smokeless tobacco. She reports that she drinks alcohol. She reports that she uses illicit drugs (Cocaine).  Additional Social History:  Alcohol / Drug Use History of alcohol / drug use?: Yes Substance #1 Name of Substance 1: Alcohol 1 - Age of First Use: 10 1 - Amount (size/oz): 2 bottles of wine, fifth of homemade white liquior, 12 pk of beer (natural ice) 1 - Frequency: daily 1 - Last Use / Amount: 02/10/2016 Substance #2 Name of Substance 2: Crack Cocaine 2 - Age of First Use: 14 2 - Amount (size/oz): 1 gram 2 - Frequency: daily 2 - Last Use / Amount: 02/10/2016  CIWA: CIWA-Ar BP: 116/64 mmHg Pulse Rate: 80 COWS:    Allergies:   Allergies  Allergen Reactions  . Clindamycin/Lincomycin Nausea And Vomiting  . Other Nausea And Vomiting    NO ONIONS OR PEPPERS  Unknown antibiotic    Home Medications:  (Not in a hospital admission)  OB/GYN Status:  Patient's last menstrual period was 02/10/2016.  General Assessment Data [facility] Assessment: In system Is this a Tele or Face-to-Face Assessment?: Face-to-Face Is this an Initial Assessment or a Re-assessment for this encounter?: Initial Assessment Marital status: Married [place]iden name: Mull[place] Is patient pregnant?: No Pregnancy Status: No Living Arrangements: Other (Comment) (homeless) Can pt return to current living arrangement?: Yes Admission Status: Voluntary Is patient capable of signing voluntary admission?: Yes Referral Source: Self/Family/Friend Insurance type: None  Medical Screening Exam [facility] Walk-in ONLY) Medical Exam completed: Yes  Crisis Care Plan Living Arrangements: Other (Comment) (homeless) Legal Guardian: Other: (Self) Name of Psychiatrist: None Name of Therapist: None  Education Status Is patient currently in school?: No Current Grade: n/a Highest grade of school patient has completed: 8th Name of school: [employer] Contact person: n/a  Risk to self with the past 6 months Suicidal Ideation: No-Not Currently/Within Last 6 Months Has patient been a risk to self within the past 6 months prior to admission? : No Suicidal Intent: No Has patient had any suicidal intent within the past 6 months prior to admission? : No Is patient at risk for  suicide?: No Suicidal Plan?: Yes-Currently Present Has patient had any suicidal plan within the past 6 months prior to admission? : Yes Specify Current Suicidal Plan: Overdose Access to Means: No What has been your use of drugs/alcohol within the last 12 months?: daily use of crack cocaine and alcohol Previous Attempts/Gestures: Yes How many times?: 3 Other  Self Harm Risks: denied Triggers for Past Attempts: Unknown Intentional Self Injurious Behavior: None Family Suicide History: No Recent stressful life event(s): Financial Problems, Conflict (Comment) (Relationship problems, housing problems ) Persecutory voices/beliefs?: No Depression: Yes Depression Symptoms: Feeling worthless/self pity Substance abuse history and/or treatment for substance abuse?: Yes Suicide prevention information given to non-admitted patients: Not applicable  Risk to Others within the past 6 months Homicidal Ideation: No Does patient have any lifetime risk of violence toward others beyond the six months prior to admission? : No Thoughts of Harm to Others: No Current Homicidal Intent: No Current Homicidal Plan: No Access to Homicidal Means: No Identified Victim: None identified History of harm to others?: No Assessment of Violence: None Noted Violent Behavior Description: denied Does patient have access to weapons?: No Criminal Charges Pending?: Yes Describe Pending Criminal Charges: Possession of Drug paraphernalia, obtaining property under false pretense Does patient have a court date: Yes Court Date: 03/11/16 (03/12/2016) Is patient on probation?: No  Psychosis Hallucinations: None noted Delusions: None noted  Mental Status Report Appearance/Hygiene: Unremarkable Eye Contact: Fair Motor Activity: Unremarkable Speech: Logical/coherent Level of Consciousness: Alert Mood: Depressed Affect: Appropriate to circumstance Anxiety Level: None Thought Processes: Coherent Judgement: Unimpaired Orientation: Place, Time, Situation, Person Obsessive Compulsive Thoughts/Behaviors: None  Cognitive Functioning Concentration: Normal Memory: Recent Intact IQ: Average Insight: Fair Impulse Control: Fair Appetite: Fair Sleep: Decreased Vegetative Symptoms: None  ADLScreening [facility]) Patient's cognitive ability adequate to safely complete  daily activities?: Yes Patient able to express need for assistance with ADLs?: Yes Independently performs ADLs?: Yes (appropriate for developmental age)  Prior Inpatient Therapy Prior Inpatient Therapy: Yes Prior Therapy Dates: 2014 Prior Therapy Facilty/Provider(s): [facility] Reason for Treatment: Suicide attempt  Prior Outpatient Therapy Prior Outpatient Therapy: No Prior Therapy Dates: n/a Prior Therapy Facilty/Provider(s): n/a Reason for Treatment: n/a Does patient have an ACCT team?: No Does patient have Intensive In-House Services?  : No Does patient have [facility] services? : No Does patient have P4CC services?: No  ADL Screening (condition at time of admission) Patient's cognitive ability adequate to safely complete daily activities?: Yes Patient able to express need for assistance with ADLs?: Yes Independently performs ADLs?: Yes (appropriate for developmental age)             [employer] (For Healthcare) Does patient have an advance directive?: No Would patient like information on creating an advanced directive?: No - patient declined information    Additional Information 1:1 In Past 12 Months?: No CIRT Risk: No Elopement Risk: No Does patient have medical clearance?: No     Disposition:  Disposition Initial Assessment Completed for this Encounter: Yes Disposition of Patient: Other dispositions  On Site Evaluation by:   Reviewed with Physician:    Justice Deeds 02/11/2016 3:17 AM

## 2016-02-11 NOTE — ED Notes (Signed)
BEHAVIORAL HEALTH ROUNDING Patient sleeping: No. Patient alert and oriented: yes Behavior appropriate: Yes.  ; If no, describe:  Nutrition and fluids offered: Yes  Toileting and hygiene offered: Yes  Sitter present: q 15 minute checks Law enforcement present: Yes  

## 2016-02-11 NOTE — ED Notes (Signed)
[facility] "I can not walk, I have been using a wheelchair"

## 2016-02-11 NOTE — Discharge Instructions (Signed)
1. Take antibiotics as prescribed (Bactrim DS 2 tablets twice daily 10 days). 2. Return to the ER for worsening symptoms, fever, persistent vomiting, feelings of hurting yourself or others, or other concerns.  Cellulitis Cellulitis is an infection of the skin and the tissue beneath it. The infected area is usually red and tender. Cellulitis occurs most often in the arms and lower legs.  CAUSES  Cellulitis is caused by bacteria that enter the skin through cracks or cuts in the skin. The most common types of bacteria that cause cellulitis are staphylococci and streptococci. SIGNS AND SYMPTOMS   Redness and warmth.  Swelling.  Tenderness or pain.  Fever. DIAGNOSIS  Your health care provider can usually determine what is wrong based on a physical exam. Blood tests may also be done. TREATMENT  Treatment usually involves taking an antibiotic medicine. HOME CARE INSTRUCTIONS   Take your antibiotic medicine as directed by your health care provider. Finish the antibiotic even if you start to feel better.  Keep the infected arm or leg elevated to reduce swelling.  Apply a warm cloth to the affected area up to 4 times per day to relieve pain.  Take medicines only as directed by your health care provider.  Keep all follow-up visits as directed by your health care provider. SEEK MEDICAL CARE IF:   You notice red streaks coming from the infected area.  Your red area gets larger or turns dark in color.  Your bone or joint underneath the infected area becomes painful after the skin has healed.  Your infection returns in the same area or another area.  You notice a swollen bump in the infected area.  You develop new symptoms.  You have a fever. SEEK IMMEDIATE MEDICAL CARE IF:   You feel very sleepy.  You develop vomiting or diarrhea.  You have a general ill feeling (malaise) with muscle aches and pains.   This information is not intended to replace advice given to you by your  health care provider. Make sure you discuss any questions you have with your health care provider.   Document Released: 05/27/2005 Document Revised: 05/08/2015 Document Reviewed: 11/02/2011 Elsevier Interactive Patient Education 2016 [employer].  Polysubstance Abuse When people abuse more than one drug or type of drug it is called polysubstance or polydrug abuse. For example, many smokers also drink alcohol. This is one form of polydrug abuse. Polydrug abuse also refers to the use of a drug to counteract an unpleasant effect produced by another drug. It may also be used to help with withdrawal from another drug. People who take stimulants may become agitated. Sometimes this agitation is countered with a tranquilizer. This helps protect against the unpleasant side effects. Polydrug abuse also refers to the use of different drugs at the same time.  Anytime drug use is interfering with normal living activities, it has become abuse. This includes problems with family and friends. Psychological dependence has developed when your mind tells you that the drug is needed. This is usually followed by physical dependence which has developed when continuing increases of drug are required to get the same feeling or "high". This is known as addiction or chemical dependency. A person's risk is much higher if there is a history of chemical dependency in the family. SIGNS OF CHEMICAL DEPENDENCY  You have been told by friends or family that drugs have become a problem.  You fight when using drugs.  You are having blackouts (not remembering what you do while using).  You feel sick from using drugs but continue using.  You lie about use or amounts of drugs (chemicals) used.  You need chemicals to get you going.  You are suffering in work performance or in school because of drug use.  You get sick from use of drugs but continue to use anyway.  You need drugs to relate to people or feel comfortable in social  situations.  You use drugs to forget problems. "Yes" answered to any of the above signs of chemical dependency indicates there are problems. The longer the use of drugs continues, the greater the problems will become. If there is a family history of drug or alcohol use, it is best not to experiment with these drugs. Continual use leads to tolerance. After tolerance develops more of the drug is needed to get the same feeling. This is followed by addiction. With addiction, drugs become the most important part of life. It becomes more important to take drugs than participate in the other usual activities of life. This includes relating to friends and family. Addiction is followed by dependency. Dependency is a condition where drugs are now needed not just to get high, but to feel normal. Addiction cannot be cured but it can be stopped. This often requires outside help and the care of professionals. Treatment centers are listed in the yellow pages under: Cocaine, Narcotics, and Alcoholics Anonymous. Most hospitals and clinics can refer you to a specialized care center. Talk to your caregiver if you need help.   This information is not intended to replace advice given to you by your health care provider. Make sure you discuss any questions you have with your health care provider.   Document Released: 04/08/2005 Document Revised: 11/09/2011 Document Reviewed: 08/22/2014 Elsevier Interactive Patient Education 2016 Else[employer] Inc[employer]  Major Depressive Disorder Major depressive disorder is a mental illness. It also may be called clinical depression or unipolar depression. Major depressive disorder usually causes feelings of sadness, hopelessness, or helplessness. Some people with this disorder do not feel particularly sad but lose interest in doing things they used to enjoy (anhedonia). Major depressive disorder also can cause physical symptoms. It can interfere with work, school, relationships, and other normal  everyday activities. The disorder varies in severity but is longer lasting and more serious than the sadness we all feel from time to time in our lives. Major depressive disorder often is triggered by stressful life events or major life changes. Examples of these triggers include divorce, loss of your job or home, a move, and the death of a family member or close friend. Sometimes this disorder occurs for no obvious reason at all. People who have family members with major depressive disorder or bipolar disorder are at higher risk for developing this disorder, with or without life stressors. Major depressive disorder can occur at any age. It may occur just once in your life (single episode major depressive disorder). It may occur multiple times (recurrent major depressive disorder). SYMPTOMS People with major depressive disorder have either anhedonia or depressed mood on nearly a daily basis for at least 2 weeks or longer. Symptoms of depressed mood include:  Feelings of sadness (blue or down in the dumps) or emptiness.  Feelings of hopelessness or helplessness.  Tearfulness or episodes of crying (may be observed by others).  Irritability (children and adolescents). In addition to depressed mood or anhedonia or both, people with this disorder have at least four of the following symptoms:  Difficulty sleeping or sleeping too much.  Significant change (increase or decrease) in appetite or weight.   Lack of energy or motivation.  Feelings of guilt and worthlessness.   Difficulty concentrating, remembering, or making decisions.  Unusually slow movement (psychomotor retardation) or restlessness (as observed by others).   Recurrent wishes for death, recurrent thoughts of self-harm (suicide), or a suicide attempt. People with major depressive disorder commonly have persistent negative thoughts about themselves, other people, and the world. People with severe major depressive disorder may  experiencedistorted beliefs or perceptions about the world (psychotic delusions). They also may see or hear things that are not real (psychotic hallucinations). DIAGNOSIS Major depressive disorder is diagnosed through an assessment by your health care provider. Your health care provider will ask aboutaspects of your daily life, such as mood,sleep, and appetite, to see if you have the diagnostic symptoms of major depressive disorder. Your health care provider may ask about your medical history and use of alcohol or drugs, including prescription medicines. Your health care provider also may do a physical exam and blood work. This is because certain medical conditions and the use of certain substances can cause major depressive disorder-like symptoms (secondary depression). Your health care provider also may refer you to a mental health specialist for further evaluation and treatment. TREATMENT It is important to recognize the symptoms of major depressive disorder and seek treatment. The following treatments can be prescribed for this disorder:   Medicine. Antidepressant medicines usually are prescribed. Antidepressant medicines are thought to correct chemical imbalances in the brain that are commonly associated with major depressive disorder. Other types of medicine may be added if the symptoms do not respond to antidepressant medicines alone or if psychotic delusions or hallucinations occur.  Talk therapy. Talk therapy can be helpful in treating major depressive disorder by providing support, education, and guidance. Certain types of talk therapy also can help with negative thinking (cognitive behavioral therapy) and with relationship issues that trigger this disorder (interpersonal therapy). A mental health specialist can help determine which treatment is best for you. Most people with major depressive disorder do well with a combination of medicine and talk therapy. Treatments involving electrical  stimulation of the brain can be used in situations with extremely severe symptoms or when medicine and talk therapy do not work over time. These treatments include electroconvulsive therapy, transcranial magnetic stimulation, and vagal nerve stimulation.   This information is not intended to replace advice given to you by your health care provider. Make sure you discuss any questions you have with your health care provider.   Document Released: 12/12/2012 Document Revised: 09/07/2014 Document Reviewed: 12/12/2012 Elsevier Interactive Patient Education [employer]

## 2016-02-11 NOTE — Consult Note (Signed)
[place] Psychiatry Consult   Reason for Consult:  Consult for 30 year old woman with a history of substance abuse who came into the emergency room for cellulitis but then started talking about having suicidal thoughts Referring Physician:  Mariea Clonts Patient Identification: Carly Drake MRN:  [id number] Principal Diagnosis: Substance induced mood disorder ([place]) Diagnosis:   Patient Active Problem List   Diagnosis Date Noted  . Cellulitis [L03.90] 02/11/2016  . Substance induced mood disorder ([place]) [F19.94] 02/11/2016  . Alcohol abuse [F10.10] 02/11/2016  . Cocaine abuse [F14.10] 02/11/2016  . 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 female patient admitted with "I'm tired and hurting".  HPI:  Patient interviewed. Chart reviewed. Old notes reviewed. Labs and vitals reviewed. 30 year old woman presented to the emergency room with complaints of cellulitis in her foot. During workup she talked about being depressed and having suicidal thoughts. On interview today the patient says she is exhausted tired and in pain. She's been thinking about cutting herself in the wrist which is how she has tried to kill her self in the past. Her mood has been down and depressed and negative and hopeless. She has slept poorly for several days. Appetite poor. She has been on a binge of drinking and using crack cocaine daily. Last use of alcohol was just prior to coming into the hospital. Patient no longer has any place to live and feels overwhelmed by her problems. She is having some visual hallucinations probably typical of alcohol withdrawal. No violence or threats to anyone else. No outpatient treatment in place.  Social history: Patient had previously been living with a roommate but got kicked out a couple days ago. She has no place to stay and no income. She does have children but has no contact with  him.  Medical history: Patient has a cellulitis on her left foot that is been brewing up for a while and she hasn't gotten treated until now. She got IV antibiotics in the emergency room and the rest of the treatment course will be oral antibiotics.  Substance abuse history: Long history of abuse of alcohol and drugs. Only extended periods of sobriety were while she was in jail or prison. Doesn't seem to have any serious history of engaging in substance abuse treatment. No history of seizures with withdrawal from alcohol.  Past Psychiatric History: Patient had one previous psychiatric admission at [facility] in 2014 after a suicide attempt. She says that she has cut herself on the wrist at least 3 times total. She can't remember any medicine she was prescribed in the past. Hasn't followed up with outpatient mental health treatment.  Risk to Self: Suicidal Ideation: No-Not Currently/Within Last 6 Months Suicidal Intent: No Is patient at risk for suicide?: No Suicidal Plan?: Yes-Currently Present Specify Current Suicidal Plan: Overdose Access to Means: No What has been your use of drugs/alcohol within the last 12 months?: daily use of crack cocaine and alcohol How many times?: 3 Other Self Harm Risks: denied Triggers for Past Attempts: Unknown Intentional Self Injurious Behavior: None Risk to Others: Homicidal Ideation: No Thoughts of Harm to Others: No Current Homicidal Intent: No Current Homicidal Plan: No Access to Homicidal Means: No Identified Victim: None identified History of harm to others?: No Assessment of Violence: None Noted Violent Behavior Description: denied Does patient have access to weapons?: No Criminal Charges Pending?: Yes Describe Pending Criminal Charges: Possession of  Drug paraphernalia, obtaining property under false pretense Does patient have a court date: Yes Court Date: 03/11/16 (03/12/2016) Prior Inpatient Therapy: Prior Inpatient Therapy: Yes Prior  Therapy Dates: 2014 Prior Therapy Facilty/Provider(s): [facility] Reason for Treatment: Suicide attempt Prior Outpatient Therapy: Prior Outpatient Therapy: No Prior Therapy Dates: n/a Prior Therapy Facilty/Provider(s): n/a Reason for Treatment: n/a Does patient have an ACCT team?: No Does patient have Intensive In-House Services?  : No Does patient have [facility] services? : No Does patient have P4CC services?: No  Past Medical History:  Past Medical History  Diagnosis Date  . Pilonidal cyst     Past Surgical History  Procedure Laterality Date  . Cesarean section     Family History: History reviewed. No pertinent family history. Family Psychiatric  History: She says that her mother talked with psychiatrist a lot and that many people in her family have substance abuse problems Social History:  History  Alcohol Use  . Yes     History  Drug Use  . Yes  . Special: Cocaine    Social History   Social History  . Marital Status: Single    Spouse Name: N/A  . Number of Children: N/A  . Years of Education: N/A   Social History Main Topics  . Smoking status: Current Every Day Smoker -- 1.00 packs/day    Types: Cigarettes  . Smokeless tobacco: Current User  . Alcohol Use: Yes  . Drug Use: Yes    Special: Cocaine  . Sexual Activity: Not Asked   Other Topics Concern  . None   Social History Narrative   Additional Social History:    Allergies:   Allergies  Allergen Reactions  . Clindamycin/Lincomycin Nausea And Vomiting  . Other Nausea And Vomiting    NO ONIONS OR PEPPERS  Unknown antibiotic    Labs:  Results for orders placed or performed during the hospital encounter of 02/11/16 (from the past 48 hour(s))  CBC with Differential/Platelet     Status: Abnormal   Collection Time: 02/10/16 11:40 PM  Result Value Ref Range   WBC 7.8 3.6 - 11.0 K/uL   RBC 4.66 3.80 - 5.20 MIL/uL   Hemoglobin 14.1 12.0 - 16.0 g/dL   HCT 41.0 35.0 - 47.0 %   MCV 87.8 80.0 - 100.0  fL   MCH 30.2 26.0 - 34.0 pg   MCHC 34.4 32.0 - 36.0 g/dL   RDW 15.2 (H) 11.5 - 14.5 %   Platelets 259 150 - 440 K/uL   Neutrophils Relative % 63 %   Neutro Abs 4.9 1.4 - 6.5 K/uL   Lymphocytes Relative 26 %   Lymphs Abs 2.0 1.0 - 3.6 K/uL   Monocytes Relative 9 %   Monocytes Absolute 0.7 0.2 - 0.9 K/uL   Eosinophils Relative 1 %   Eosinophils Absolute 0.1 0 - 0.7 K/uL   Basophils Relative 1 %   Basophils Absolute 0.1 0 - 0.1 K/uL  Comprehensive metabolic panel     Status: Abnormal   Collection Time: 02/10/16 11:40 PM  Result Value Ref Range   Sodium 137 135 - 145 mmol/L   Potassium 3.2 (L) 3.5 - 5.1 mmol/L   Chloride 104 101 - 111 mmol/L   CO2 25 22 - 32 mmol/L   Glucose, Bld 100 (H) 65 - 99 mg/dL   BUN 9 6 - 20 mg/dL   Creatinine, Ser 0.64 0.44 - 1.00 mg/dL   Calcium 8.8 (L) 8.9 - 10.3 mg/dL   Total Protein  7.8 6.5 - 8.1 g/dL   Albumin 3.7 3.5 - 5.0 g/dL   AST 22 15 - 41 U/L   ALT 13 (L) 14 - 54 U/L   Alkaline Phosphatase 68 38 - 126 U/L   Total Bilirubin 0.3 0.3 - 1.2 mg/dL   GFR calc non Af Amer >60 >60 mL/min   GFR calc Af Amer >60 >60 mL/min    Comment: (NOTE) The eGFR has been calculated using the CKD EPI equation. This calculation has not been validated in all clinical situations. eGFR's persistently <60 mL/min signify possible Chronic Kidney Disease.    Anion gap 8 5 - 15  Ethanol     Status: Abnormal   Collection Time: 02/10/16 11:40 PM  Result Value Ref Range   Alcohol, Ethyl (B) 15 (H) <5 mg/dL    Comment:        LOWEST DETECTABLE LIMIT FOR SERUM ALCOHOL IS 5 mg/dL FOR MEDICAL PURPOSES ONLY   Acetaminophen level     Status: Abnormal   Collection Time: 02/10/16 11:40 PM  Result Value Ref Range   Acetaminophen (Tylenol), Serum <10 (L) 10 - 30 ug/mL    Comment:        THERAPEUTIC CONCENTRATIONS VARY SIGNIFICANTLY. A RANGE OF 10-30 ug/mL MAY BE AN EFFECTIVE CONCENTRATION FOR MANY PATIENTS. HOWEVER, SOME ARE BEST TREATED AT CONCENTRATIONS OUTSIDE  THIS RANGE. ACETAMINOPHEN CONCENTRATIONS >150 ug/mL AT 4 HOURS AFTER INGESTION AND >50 ug/mL AT 12 HOURS AFTER INGESTION ARE OFTEN ASSOCIATED WITH TOXIC REACTIONS.   Salicylate level     Status: None   Collection Time: 02/10/16 11:40 PM  Result Value Ref Range   Salicylate Lvl <6.9 2.8 - 30.0 mg/dL  Urine Drug Screen, Qualitative ([facility] only)     Status: Abnormal   Collection Time: 02/11/16 12:02 PM  Result Value Ref Range   Tricyclic, Ur Screen NONE DETECTED NONE DETECTED   Amphetamines, Ur Screen NONE DETECTED NONE DETECTED   MDMA (Ecstasy)Ur Screen NONE DETECTED NONE DETECTED   Cocaine Metabolite,Ur [place] POSITIVE (A) NONE DETECTED   Opiate, Ur Screen NONE DETECTED NONE DETECTED   Phencyclidine (PCP) Ur S NONE DETECTED NONE DETECTED   Cannabinoid 50 Ng, Ur [place] NONE DETECTED NONE DETECTED   Barbiturates, Ur Screen NONE DETECTED NONE DETECTED   Benzodiazepine, Ur Scrn NONE DETECTED NONE DETECTED   Methadone Scn, Ur NONE DETECTED NONE DETECTED    Comment: (NOTE) 678  Tricyclics, urine               Cutoff 1000 ng/mL 200  Amphetamines, urine             Cutoff 1000 ng/mL 300  MDMA (Ecstasy), urine           Cutoff 500 ng/mL 400  Cocaine Metabolite, urine       Cutoff 300 ng/mL 500  Opiate, urine                   Cutoff 300 ng/mL 600  Phencyclidine (PCP), urine      Cutoff 25 ng/mL 700  Cannabinoid, urine              Cutoff 50 ng/mL 800  Barbiturates, urine             Cutoff 200 ng/mL 900  Benzodiazepine, urine           Cutoff 200 ng/mL 1000 Methadone, urine                Cutoff 300 ng/mL 1100  1200 The urine drug screen provides only a preliminary, unconfirmed 1300 analytical test result and should not be used for non-medical 1400 purposes. Clinical consideration and professional judgment should 1500 be applied to any positive drug screen result due to possible 1600 interfering substances. A more specific alternate chemical method 1700 must be used in order to obtain a  confirmed analytical result.  1800 Gas chromato graphy / mass spectrometry (GC/MS) is the preferred 1900 confirmatory method.   Urinalysis complete, with microscopic ([facility] only)     Status: Abnormal   Collection Time: 02/11/16 12:02 PM  Result Value Ref Range   Color, Urine STRAW (A) YELLOW   APPearance CLEAR (A) CLEAR   Glucose, UA NEGATIVE NEGATIVE mg/dL   Bilirubin Urine NEGATIVE NEGATIVE   Ketones, ur NEGATIVE NEGATIVE mg/dL   Specific Gravity, Urine 1.009 1.005 - 1.030   Hgb urine dipstick NEGATIVE NEGATIVE   pH 8.0 5.0 - 8.0   Protein, ur NEGATIVE NEGATIVE mg/dL   Nitrite NEGATIVE NEGATIVE   Leukocytes, UA TRACE (A) NEGATIVE   RBC / HPF 0-5 0 - 5 RBC/hpf   WBC, UA 0-5 0 - 5 WBC/hpf   Bacteria, UA RARE (A) NONE SEEN   Squamous Epithelial / LPF 0-5 (A) NONE SEEN   Mucous PRESENT   Pregnancy, urine POC     Status: None   Collection Time: 02/11/16 12:06 PM  Result Value Ref Range   Preg Test, Ur NEGATIVE NEGATIVE    Comment:        THE SENSITIVITY OF THIS METHODOLOGY IS >24 mIU/mL     Current Facility-Administered Medications  Medication Dose Route Frequency Provider Last Rate Last Dose  . LORazepam (ATIVAN) tablet 0-4 mg  0-4 mg Oral Q6H Paulette Blanch, MD   0 mg at 02/11/16 2025  . sulfamethoxazole-trimethoprim (BACTRIM DS,SEPTRA DS) 800-160 MG per tablet 2 tablet  2 tablet Oral Q12H Paulette Blanch, MD   2 tablet at 02/11/16 0915  . thiamine (VITAMIN B-1) tablet 100 mg  100 mg Oral Daily Paulette Blanch, MD   100 mg at 02/11/16 0915   Current Outpatient Prescriptions  Medication Sig Dispense Refill  . sulfamethoxazole-trimethoprim (BACTRIM DS,SEPTRA DS) 800-160 MG tablet Take 2 tablets by mouth 2 (two) times daily. 40 tablet 0    Musculoskeletal: Strength & Muscle Tone: within normal limits Gait & Station: Impaired by the foot infection Patient leans: N/A  Psychiatric Specialty Exam: Physical Exam  Constitutional: She appears well-developed and well-nourished.  HENT:   Head: Normocephalic and atraumatic.  Eyes: Conjunctivae are normal. Pupils are equal, round, and reactive to light.  Neck: Normal range of motion.  Cardiovascular: Normal heart sounds.   Respiratory: Effort normal.  GI: Soft.  Musculoskeletal: Normal range of motion.  Neurological: She is alert.  Skin: Skin is warm and dry.     Psychiatric: Her affect is blunt. Her speech is delayed. She is slowed. She expresses impulsivity. She exhibits a depressed mood. She expresses suicidal ideation. She exhibits abnormal recent memory.    Review of Systems  Constitutional: Negative.   HENT: Negative.   Eyes: Negative.   Respiratory: Negative.   Cardiovascular: Negative.   Gastrointestinal: Negative.   Musculoskeletal:       Pain in the left foot in area of infection  Skin: Negative.   Neurological: Negative.   Psychiatric/Behavioral: Positive for depression, suicidal ideas and substance abuse. Negative for hallucinations and memory loss. The patient is nervous/anxious and has insomnia.  Blood pressure 91/57, pulse 78, temperature 98.5 F (36.9 C), temperature source Oral, resp. rate 16, height _0  (1.626 m), weight 81.647 kg (180 lb), last menstrual period 02/10/2016, SpO2 99 %.Body mass index is 30.88 kg/(m^2).  General Appearance: Disheveled  Eye Contact:  Fair  Speech:  Slow  Volume:  Decreased  Mood:  Depressed  Affect:  Depressed  Thought Process:  Goal Directed  Orientation:  Full (Time, Place, and Person)  Thought Content:  Hallucinations: Visual  Suicidal Thoughts:  Yes.  with intent/plan  Homicidal Thoughts:  No  Memory:  Immediate;   Good Recent;   Fair Remote;   Fair  Judgement:  Fair  Insight:  Fair  Psychomotor Activity:  Decreased  Concentration:  Concentration: Fair  Recall:  [employer] of Knowledge:  Fair  Language:  Fair  Akathisia:  No  Handed:  Right  AIMS (if indicated):     Assets:  Desire for Improvement Resilience  ADL's:  Intact  Cognition:   WNL  Sleep:        Treatment Plan Summary: Daily contact with patient to assess and evaluate symptoms and progress in treatment, Medication management and Plan 30 year old woman with a history of alcohol abuse cocaine abuse and depression. She will be admitted to the psychiatric ward. 15 minute checks in place for suicidality. She will be on CIWA protocol with Ativan when necessary for alcohol withdrawal. Also has Ativan available when necessary for agitation and trazodone when necessary for sleep. Continue Septra as prescribed in the emergency room for her foot. Patient can have ibuprofen for pain in the foot. She will be engaged in groups and activities on the unit.  Disposition: Recommend psychiatric Inpatient admission when medically cleared. Supportive therapy provided about ongoing stressors.  Alethia Berthold, MD 02/11/2016 2:03 PM

## 2016-02-11 NOTE — BH Assessment (Signed)
Writer faxed referral to [facility].  Writer spoke to the nurse regarding the patients ability to walk.

## 2016-02-11 NOTE — ED Notes (Signed)
Patient to ED for swollen and painful left foot. Presents with redness and swelling over the instep of the left foot. Denies being bitten by an insect, no obvious bite noted. Motor/sensation intact.

## 2016-02-11 NOTE — ED Notes (Signed)
Pt was given supper tray. Pt ambulated to bathroom and is calling spouse at this time.

## 2016-02-11 NOTE — BH Assessment (Signed)
Writer informed the Charge Nurse Victorino Dike(Jennifer) that the patient [facility].  The Charge Nurse is reviewing the patients chart.

## 2016-02-11 NOTE — ED Notes (Signed)
Dietary called for breakfast tray.

## 2016-02-11 NOTE — Consult Note (Signed)
  Psychiatry: Brief note. Full note to follow. Patient with alcohol and cocaine abuse. Mood down. No place to stay. No acutely suicidal. Request TTS to look into detox.

## 2016-02-11 NOTE — ED Notes (Signed)
Pt given breakfast tray

## 2016-02-11 NOTE — ED Provider Notes (Signed)
[facility] Emergency Department Provider Note   ____________________________________________  Time seen: Approximately 2:21 AM  I have reviewed the triage vital signs and the nursing notes.   HISTORY  Chief Complaint Cellulitis    HPI Carly Drake is a 30 y.o. female who presents to the ED from home with a chief complaint of left foot pain, swelling, redness as well as depression. Patient reports a three-day history of increasing left foot pain, swelling and redness. Denies trauma, injury or insect bites. Denies associated fever, chills, chest pain, shortness of breath, abdominal pain, nausea, vomiting, diarrhea. Denies recent travel. Nothing makes her symptoms better or worse. Patient also reports multiple stressors as well as trying to get off of alcohol and crack cocaine. Last use of both 2 days ago. Complains of depression with vague SI without plan. Denies associated HI/AH/VH.   Past Medical History  Diagnosis Date  . Pilonidal cyst     There are no active problems to display for this patient.   Past Surgical History  Procedure Laterality Date  . Cesarean section      Current Outpatient Rx  Name  Route  Sig  Dispense  Refill  . sulfamethoxazole-trimethoprim (BACTRIM DS,SEPTRA DS) 800-160 MG tablet   Oral   Take 2 tablets by mouth 2 (two) times daily.   40 tablet   0     Allergies Clindamycin/lincomycin and Other  History reviewed. No pertinent family history.  Social History Social History  Substance Use Topics  . Smoking status: Current Every Day Smoker -- 1.00 packs/day    Types: Cigarettes  . Smokeless tobacco: Current User  . Alcohol Use: Yes    Review of Systems  Constitutional: No fever/chills. Eyes: No visual changes. ENT: No sore throat. Cardiovascular: Denies chest pain. Respiratory: Denies shortness of breath. Gastrointestinal: No abdominal pain.  No nausea, no vomiting.  No diarrhea.  No  constipation. Genitourinary: Negative for dysuria. Musculoskeletal: Positive for left foot pain, swelling and redness. Negative for back pain. Skin: Negative for rash. Neurological: Negative for headaches, focal weakness or numbness. Psychiatric:Positive for depression with vague SI without plan.  10-point ROS otherwise negative.  ____________________________________________   PHYSICAL EXAM:  VITAL SIGNS: ED Triage Vitals  Enc Vitals Group     BP 02/10/16 2325 116/64 mmHg     Pulse Rate 02/10/16 2325 80     Resp 02/10/16 2325 18     Temp 02/10/16 2325 98.5 F (36.9 C)     Temp Source 02/10/16 2325 Oral     SpO2 02/10/16 2325 98 %     Weight 02/10/16 2325 180 lb (81.647 kg)     Height 02/10/16 2325  (1.626 m)     Head Cir --      Peak Flow --      Pain Score 02/10/16 2358 10     Pain Loc --      Pain Edu? --      Excl. in GC? --     Constitutional: Alert and oriented. Well appearing and in no acute distress. Eyes: Conjunctivae are normal. PERRL. EOMI. Head: Atraumatic. Nose: No congestion/rhinnorhea. Mouth/Throat: Mucous membranes are moist.  Oropharynx non-erythematous. Neck: No stridor.   Cardiovascular: Normal rate, regular rhythm. Grossly normal heart sounds.  Good peripheral circulation. Respiratory: Normal respiratory effort.  No retractions. Lungs CTAB. Gastrointestinal: Soft and nontender. No distention. No abdominal bruits. No CVA tenderness. Musculoskeletal: Left foot with mild swelling. Warmth and erythema over her lateral and dorsal aspect  of foot. Multiple cracking in skin.  Sole of foot with caked dirt. No joint effusions. 2+ distal pulses. Symmetrically warm limb without evidence for ischemia. Supple Without evidence for compartment syndrome. Neurologic:  Normal speech and language. No gross focal neurologic deficits are appreciated.  Skin:  Skin is warm, dry and intact. No rash noted. Psychiatric: Mood and affect are flat. Speech and behavior are  normal.  ____________________________________________   LABS (all labs ordered are listed, but only abnormal results are displayed)  Labs Reviewed  CBC WITH DIFFERENTIAL/PLATELET - Abnormal; Notable for the following:    RDW 15.2 (*)    All other components within normal limits  COMPREHENSIVE METABOLIC PANEL - Abnormal; Notable for the following:    Potassium 3.2 (*)    Glucose, Bld 100 (*)    Calcium 8.8 (*)    ALT 13 (*)    All other components within normal limits  ETHANOL - Abnormal; Notable for the following:    Alcohol, Ethyl (B) 15 (*)    All other components within normal limits  ACETAMINOPHEN LEVEL - Abnormal; Notable for the following:    Acetaminophen (Tylenol), Serum <10 (*)    All other components within normal limits  SALICYLATE LEVEL  URINE DRUG SCREEN, QUALITATIVE ([facility] ONLY)  URINALYSIS COMPLETEWITH MICROSCOPIC ([facility] ONLY)  POC URINE PREG, ED   ____________________________________________  EKG  None ____________________________________________  RADIOLOGY  None ____________________________________________   PROCEDURES  Procedure(s) performed: None  Critical Care performed: No  ____________________________________________   INITIAL IMPRESSION / ASSESSMENT AND PLAN / ED COURSE  Pertinent labs & imaging results that were available during my care of the patient were reviewed by me and considered in my medical decision making (see chart for details).  30 year old female who presents with left foot cellulitis as well as depression with vague SI without plan. Lab work unremarkable. Will initiate IV antibiotics for cellulitis. Consult TTS and psychiatry for complaints of depression. Patient will remain voluntary and contracts for safety while in the emergency department.  ----------------------------------------- 6:46 AM on 02/11/2016 -----------------------------------------  No events overnight. Patient was evaluated by TTS who refers her for  psychiatry consult this morning. Patient will remain on a voluntary status and be [facility] pending psychiatry evaluation. Disposition per psychiatry. Patient will need to continue antibiotic for cellulitis. I have prepared a prescription for Bactrim DS 2 tablets twice daily 10 days as well as discharge paperwork in the event that psychiatry discharges her today.  ____________________________________________   FINAL CLINICAL IMPRESSION(S) / ED DIAGNOSES  Final diagnoses:  Cellulitis of left lower extremity  Depression  Substance abuse      NEW MEDICATIONS STARTED DURING THIS VISIT:  New Prescriptions   SULFAMETHOXAZOLE-TRIMETHOPRIM (BACTRIM DS,SEPTRA DS) 800-160 MG TABLET    Take 2 tablets by mouth 2 (two) times daily.     Note:  This document was prepared using Dragon voice recognition software and may include unintentional dictation errors.    Irean HongJade J Alezander Dimaano, MD 02/11/16 [phone]

## 2016-02-11 NOTE — ED Notes (Signed)
Pt resting in bed, resp even and unlabored, eyes closed, will continue to monitor closely

## 2016-02-12 ENCOUNTER — Inpatient Hospital Stay
Admit: 2016-02-12 | Discharge: 2016-02-17 | DRG: 885 | Disposition: A | Discharge status: Home / Self Care | Payer: No Typology Code available for payment source | Source: Intra-hospital | Attending: Psychiatry | Admitting: Psychiatry

## 2016-02-12 DIAGNOSIS — Z915 Personal history of self-harm: Secondary | ICD-10-CM | POA: Diagnosis not present

## 2016-02-12 DIAGNOSIS — Z79899 Other long term (current) drug therapy: Secondary | ICD-10-CM | POA: Diagnosis not present

## 2016-02-12 DIAGNOSIS — R45851 Suicidal ideations: Secondary | ICD-10-CM | POA: Diagnosis present

## 2016-02-12 DIAGNOSIS — F1420 Cocaine dependence, uncomplicated: Secondary | ICD-10-CM | POA: Diagnosis present

## 2016-02-12 DIAGNOSIS — M549 Dorsalgia, unspecified: Secondary | ICD-10-CM | POA: Diagnosis present

## 2016-02-12 DIAGNOSIS — G4700 Insomnia, unspecified: Secondary | ICD-10-CM | POA: Diagnosis present

## 2016-02-12 DIAGNOSIS — F332 Major depressive disorder, recurrent severe without psychotic features: Principal | ICD-10-CM | POA: Diagnosis present

## 2016-02-12 DIAGNOSIS — F329 Major depressive disorder, single episode, unspecified: Secondary | ICD-10-CM | POA: Diagnosis present

## 2016-02-12 DIAGNOSIS — Z9119 Patient's noncompliance with other medical treatment and regimen: Secondary | ICD-10-CM

## 2016-02-12 DIAGNOSIS — F1010 Alcohol abuse, uncomplicated: Secondary | ICD-10-CM | POA: Diagnosis present

## 2016-02-12 DIAGNOSIS — F4310 Post-traumatic stress disorder, unspecified: Secondary | ICD-10-CM | POA: Diagnosis present

## 2016-02-12 DIAGNOSIS — L0390 Cellulitis, unspecified: Secondary | ICD-10-CM | POA: Diagnosis present

## 2016-02-12 DIAGNOSIS — F17210 Nicotine dependence, cigarettes, uncomplicated: Secondary | ICD-10-CM | POA: Diagnosis present

## 2016-02-12 DIAGNOSIS — Z888 Allergy status to other drugs, medicaments and biological substances status: Secondary | ICD-10-CM

## 2016-02-12 DIAGNOSIS — F17200 Nicotine dependence, unspecified, uncomplicated: Secondary | ICD-10-CM | POA: Diagnosis present

## 2016-02-12 DIAGNOSIS — F1020 Alcohol dependence, uncomplicated: Secondary | ICD-10-CM | POA: Diagnosis present

## 2016-02-12 DIAGNOSIS — Z590 Homelessness: Secondary | ICD-10-CM | POA: Diagnosis not present

## 2016-02-12 MED ORDER — SULFAMETHOXAZOLE-TRIMETHOPRIM 800-160 MG PO TABS
2.0000 | ORAL_TABLET | Freq: Two times a day (BID) | ORAL | Status: DC
  Filled 2016-02-12: qty 2

## 2016-02-12 MED ORDER — SULFAMETHOXAZOLE-TRIMETHOPRIM 800-160 MG PO TABS
2.0000 | ORAL_TABLET | Freq: Two times a day (BID) | ORAL | Status: DC
  Administered 2016-02-12 – 2016-02-13 (×3): 2 via ORAL
  Administered 2016-02-13: 1 via ORAL
  Administered 2016-02-14 – 2016-02-17 (×7): 2 via ORAL
  Filled 2016-02-12 (×11): qty 2

## 2016-02-12 MED ORDER — VITAMIN B-1 100 MG PO TABS
100.0000 mg | ORAL_TABLET | Freq: Every day | ORAL | Status: DC
Start: 2016-02-12 — End: 2016-02-12

## 2016-02-12 MED ORDER — MAGNESIUM HYDROXIDE 400 MG/5ML PO SUSP: 30.0000 mL | Freq: Every day | ORAL | Status: DC | PRN

## 2016-02-12 MED ORDER — LORAZEPAM 2 MG PO TABS: 0.0000 mg | ORAL_TABLET | Freq: Four times a day (QID) | ORAL | Status: DC

## 2016-02-12 MED ORDER — NICOTINE 21 MG/24HR TD PT24
21.0000 mg | MEDICATED_PATCH | Freq: Every day | TRANSDERMAL | Status: DC
  Administered 2016-02-12 – 2016-02-17 (×6): 21 mg via TRANSDERMAL
  Filled 2016-02-12 (×6): qty 1

## 2016-02-12 MED ORDER — LORAZEPAM 2 MG PO TABS: 2.0000 mg | ORAL_TABLET | Freq: Four times a day (QID) | ORAL | Status: DC | PRN

## 2016-02-12 MED ORDER — CHLORDIAZEPOXIDE HCL 25 MG PO CAPS
25.0000 mg | ORAL_CAPSULE | Freq: Four times a day (QID) | ORAL | Status: AC
  Administered 2016-02-12 – 2016-02-14 (×12): 25 mg via ORAL
  Filled 2016-02-12 (×12): qty 1

## 2016-02-12 MED ORDER — ALUM & MAG HYDROXIDE-SIMETH 200-200-20 MG/5ML PO SUSP
30.0000 mL | ORAL | Status: DC | PRN
Start: 2016-02-12 — End: 2016-02-17

## 2016-02-12 MED ORDER — ACETAMINOPHEN 325 MG PO TABS
650.0000 mg | ORAL_TABLET | Freq: Four times a day (QID) | ORAL | Status: DC | PRN
  Administered 2016-02-12 – 2016-02-15 (×6): 650 mg via ORAL
  Filled 2016-02-12 (×6): qty 2

## 2016-02-12 MED ORDER — VITAMIN B-1 100 MG PO TABS
100.0000 mg | ORAL_TABLET | Freq: Every day | ORAL | Status: DC
  Administered 2016-02-12 – 2016-02-17 (×6): 100 mg via ORAL
  Filled 2016-02-12 (×6): qty 1

## 2016-02-12 MED ORDER — TRAZODONE HCL 100 MG PO TABS
100.0000 mg | ORAL_TABLET | Freq: Every evening | ORAL | Status: DC | PRN
  Administered 2016-02-12: 100 mg via ORAL
  Filled 2016-02-12: qty 1

## 2016-02-12 NOTE — Progress Notes (Signed)
D: Patient stated slept fairlast night .Stated appetite fairand energy level low. Stated concentration is good . Stated on Depression scale 8, hopeless 5  and anxiety 6 .( low 0-10 high) Denies suicidal  homicidal ideations  .  No auditory hallucinations  No pain concerns . Appropriate ADL'S. Interacting with peers and staff.  Patient remains on CIWA  Patient listed symptoms of  Withdrawal  Writer noted  no indication of withdrawal.  Left foot remains  reddden   Patient has a  Medical shoe on it .  A: Encourage patient participation with unit programming . Instruction  Given on  Medication , verbalize understanding. R: Voice no other concerns. Staff continue to monitor

## 2016-02-12 NOTE — Progress Notes (Signed)
[facility] She is alert and oriented x4. Pt appears drowsy due to scheduled Ativan given before arrival. Pt is provided with a wheelchair for safety. Pt reports increased depression and suicidal feelings, stating "[place] [facility] [facility] [place] [facility] I came here before that happened." Pt rates depression 8/10 and anxiety 6/10. She denies SI/HI at this time. She reports visual hallucinations of "shadows out of the corner of my eye." Pt rates pain 8/10 in her left foot. Skin assessment performed and no contraband found. Foot is visibly swollen and red d/t cellulitis. Pt reports that the cellulitis was why she came into the hospital. Pt oriented to unit and given a snack. q15 minute safety checks maintained. Pt remains free from harm. Will continue to monitor.

## 2016-02-12 NOTE — Tx Team (Signed)
Initial Interdisciplinary Treatment Plan   PATIENT STRESSORS: Marital or family conflict Substance abuse   PATIENT STRENGTHS: Average or above average intelligence Physical Health   PROBLEM LIST: Problem List/Patient Goals Date to be addressed Date deferred Reason deferred Estimated date of resolution  Depression 02/12/16     Suicidal ideation 02/12/16     Substance abuse 02/12/16                                          DISCHARGE CRITERIA:  Adequate post-discharge living arrangements Improved stabilization in mood, thinking, and/or behavior Medical problems require only outpatient monitoring Motivation to continue treatment in a less acute level of care Need for constant or close observation no longer present Verbal commitment to aftercare and medication compliance Withdrawal symptoms are absent or subacute and managed without 24-hour nursing intervention  PRELIMINARY DISCHARGE PLAN: Attend aftercare/continuing care group Attend 12-step recovery group Outpatient therapy Placement in alternative living arrangements  PATIENT/FAMIILY INVOLVEMENT: This treatment plan has been presented to and reviewed with the patient, Carly Drake, and/or family member.  The patient and family have been given the opportunity to ask questions and make suggestions.  Carly Drake 02/12/2016, 1:01 AM

## 2016-02-12 NOTE — Progress Notes (Signed)
Recreation Therapy Notes  Date: 06.14.17 Time: 9:30 am Location: Craft Room  Group Topic: Self-esteem  Goal Area(s) Addresses:  Patient will identify at least one positive trait about self. Patient will identify at least one healthy coping skill.  Behavioral Response: Did not attend  Intervention: All About Me  Activity: Patients were instructed to make a pamphlet including their life's motto, positive traits, healthy coping skills, and their support system.  Education: LRT educated patients on ways they can increase their self-esteem.  Education Outcome: Patient did not attend group.   Clinical Observations/Feedback: Patient did not attend group.  Jacquelynn CreeGreene,Avi Archuleta M, LRT/CTRS 02/12/2016 1:10 PM

## 2016-02-12 NOTE — Plan of Care (Signed)
Problem: Coping: Goal: Ability to verbalize frustrations and anger appropriately will improve Outcome: Progressing Attending unit programing

## 2016-02-12 NOTE — [facility] Group Notes (Addendum)
[facility] LCSW Group Therapy  02/12/2016 2:53 PM  Type of Therapy:  Group Therapy  Participation Level:  Active  Participation Quality:  Attentive  Affect:  Irritable  Cognitive:  Alert  Insight:  Limited  Engagement in Therapy:  Limited  Modes of Intervention:  Discussion, Education, Socialization and Support  Summary of Progress/Problems: Emotional Regulation: Patients will identify both negative and positive emotions. They will discuss emotions they have difficulty regulating and how they impact their lives. Patients will be asked to identify healthy coping skills to combat unhealthy reactions to negative emotions.  Pt attended group and stayed the entire time. She discussed paranoia and hallucinations. She reports she has tried everything to control her emotions but nothing has worked. She shot down every suggestion made by other patients.   Carly Drake Carly Drake MSW, LCSWA  02/12/2016, 2:53 PM

## 2016-02-12 NOTE — [facility] Suicide Risk Assessment (Signed)
[facility]   Nursing information obtained from:  Patient, Review of record Demographic factors:  Low socioeconomic status, Unemployed Current Mental Status:  Suicide plan, Self-harm thoughts Loss Factors:  Loss of significant relationship, Financial problems / change in socioeconomic status Historical Factors:  Prior suicide attempts Risk Reduction Factors:  NA  Total Time spent with patient: 1 hour Principal Problem: Major depressive disorder, recurrent severe without psychotic features (HCC) Diagnosis:   Patient Active Problem List   Diagnosis Date Noted  . Cellulitis [L03.90] 02/11/2016  . Major depressive disorder, recurrent severe without psychotic features (HCC) [F33.2] 02/11/2016  . Alcohol use disorder, moderate, dependence (HCC) [F10.20] 02/11/2016  . Cocaine use disorder, moderate, dependence (HCC) [F14.20] 02/11/2016  . Tobacco use disorder [F17.200] 02/11/2016   Subjective Data: Depression, suicidal ideation, substance use.  Continued Clinical Symptoms:  Alcohol Use Disorder Identification Test Final Score (AUDIT): 22 The "Alcohol Use Disorders Identification Test", Guidelines for Use in Primary Care, Second Edition.  World Science writerHealth Organization [facility](WHO). Score between 0-7:  no or low risk or alcohol related problems. Score between 8-15:  moderate risk of alcohol related problems. Score between 16-19:  high risk of alcohol related problems. Score 20 or above:  warrants further diagnostic evaluation for alcohol dependence and treatment.   CLINICAL FACTORS:   Severe Anxiety and/or Agitation Bipolar Disorder:   Depressive phase Depression:   Comorbid alcohol abuse/dependence Impulsivity Insomnia Alcohol/Substance Abuse/Dependencies   Musculoskeletal: Strength & Muscle Tone: within normal limits Gait & Station: normal Patient leans: N/A  Psychiatric Specialty Exam: Physical Exam  Nursing note and vitals reviewed.   Review of Systems   Psychiatric/Behavioral: Positive for depression, suicidal ideas and substance abuse.  All other systems reviewed and are negative.   Blood pressure 105/66, pulse 63, temperature 98.6 F (37 C), temperature source Oral, resp. rate 20, height 5\' 4"  (1.626 m), weight 81.647 kg (180 lb), last menstrual period 02/10/2016, SpO2 100 %.Body mass index is 30.88 kg/(m^2).  General Appearance: Fairly Groomed  Eye Contact:  Good  Speech:  Clear and Coherent  Volume:  Normal  Mood:  Depressed, Hopeless and Worthless  Affect:  Blunt  Thought Process:  Goal Directed  Orientation:  Full (Time, Place, and Person)  Thought Content:  WDL  Suicidal Thoughts:  Yes.  with intent/plan  Homicidal Thoughts:  No  Memory:  Immediate;   Fair Recent;   Fair Remote;   Fair  Judgement:  Poor  Insight:  Lacking  Psychomotor Activity:  Normal  Concentration:  Concentration: Fair and Attention Span: Fair  Recall:  [employer]  Fund of Knowledge:  Fair  Language:  Fair  Akathisia:  No  Handed:  Right  AIMS (if indicated):     Assets:  Communication Skills Desire for Improvement Physical Health Resilience  ADL's:  Intact  Cognition:  WNL  Sleep:  Number of Hours: 5.5      COGNITIVE FEATURES THAT CONTRIBUTE TO RISK:  None    SUICIDE RISK:   Moderate:  Frequent suicidal ideation with limited intensity, and duration, some specificity in terms of plans, no associated intent, good self-control, limited dysphoria/symptomatology, some risk factors present, and identifiable protective factors, including available and accessible social support.  PLAN OF CARE: Hospital admission, medication management, substance abuse counseling, discharge planning.  Carly Drake is a 30 year old female with a history of bipolar depression and substance use admitted for worsening of depression and suicidal ideation in the context of relapse on substances and treatment noncompliance.  1.  Suicidal ideation. The patient is able to contract  for safety in the hospital.  2. Mood. We will start Trileptal for mood stabilization and Effexor for depression.  3. Insomnia. We'll start trazodone.  4. Alcohol abuse. She is on Librium taper.  5. Substance abuse treatment. The patient desires treatment and social worker to consider options.  6.  Cellulitis. She is on Septra.  7. Smoking. Nicotine patch is available.  8. Disposition. To be established.  I certify that inpatient services furnished can reasonably be expected to improve the patient's condition.   Kristine Linea, MD 02/12/2016, 1:03 PM

## 2016-02-12 NOTE — [facility] Group Notes (Signed)
[facility] Group Notes:  (Nursing/MHT/Case Management/Adjunct)  Date:  02/12/2016  Time:  5:09 PM  Type of Therapy:  Psychoeducational Skills  Participation Level:  Active  Participation Quality:  Appropriate  Affect:  Appropriate  Cognitive:  Appropriate  Insight:  Appropriate  Engagement in Group:  Engaged  Modes of Intervention:  Discussion and Education  Summary of Progress/Problems:  Mickey Farberamela M Moses Ellison 02/12/2016, 5:09 PM

## 2016-02-12 NOTE — Progress Notes (Signed)
Recreation Therapy Notes  INPATIENT RECREATION THERAPY ASSESSMENT  Patient Details Name: Carly LoboCrystal Drake MRN: [id number] DOB: [birth year] Today's Date: 02/12/2016  Patient Stressors: Family, Relationship, Friends, Other (Comment) Not good relationship with family - states she is the black sheep; has a 962 year old daughter who she does not get to be around because she is afraid people are out to get her; married, but everything about him is stressful - he treats her badly, but his other women he treats like queens; lack of supportive friends; no home; she has nothing; everything is stressful  Coping Skills:   Isolate, Substance Abuse, Avoidance, Art/Dance, Music, Sports  Personal Challenges: Anger, Communication, Concentration, Problem-Solving, Relationships, Self-Esteem/Confidence, Social Interaction, Stress Management, Substance Abuse, Time Management, Trusting Others  Leisure Interests (2+):  Music - Listen, Individual - Other (Comment) (Play games on phone)  Awareness of Community Resources:  Yes  Community Resources:  [employer], [place]  Current Use: No  If no, Barriers?: Social  Patient Strengths:  Hair, butt  Patient Identified Areas of Improvement:  Fix mouth, stop thinking that people are trying to hurt her  Current Recreation Participation:  Listen to music, play games on phone, drugs, alohol, selling herself on back page  Patient Goal for Hospitalization:  Try to get her mind right  [place]  [place] Residence:  [place]   Current SI (including self-harm):  No  Current HI:  No  Consent to Intern Participation: N/A   Carly Drake,Carly Drake, LRT/CTRS 02/12/2016, 3:30 PM

## 2016-02-12 NOTE — Progress Notes (Signed)
[facility] MD Progress Note  02/12/2016 7:32 PM Carly Drake  MRN:  [id number]  Subjective:  Carly Drake today very depressed and suicidal. She experiences terrible nightmares last night and was unable to sleep. She has not been able to participate in groups anxiety. She has no somatic complaints.  Principal Problem: Major depressive disorder, recurrent severe without psychotic features ([place]) Diagnosis:   Patient Active Problem List   Diagnosis Date Noted  . Cellulitis [L03.90] 02/11/2016  . Major depressive disorder, recurrent severe without psychotic features ([place]) [F33.2] 02/11/2016  . Alcohol use disorder, moderate, dependence ([place]) [F10.20] 02/11/2016  . Cocaine use disorder, moderate, dependence ([place]) [F14.20] 02/11/2016  . Tobacco use disorder [F17.200] 02/11/2016   Total Time spent with patient: 20 minutes  Past Psychiatric History: Depression, anxiety, substance use.  Past Medical History:  Past Medical History  Diagnosis Date  . Pilonidal cyst     Past Surgical History  Procedure Laterality Date  . Cesarean section     Family History: History reviewed. No pertinent family history. Family Psychiatric  History: See H&P. Social History:  History  Alcohol Use  . Yes     History  Drug Use  . Yes  . Special: Cocaine    Social History   Social History  . Marital Status: Single    Spouse Name: N/A  . Number of Children: N/A  . Years of Education: N/A   Social History Main Topics  . Smoking status: Current Every Day Smoker -- 1.00 packs/day    Types: Cigarettes  . Smokeless tobacco: Current User  . Alcohol Use: Yes  . Drug Use: Yes    Special: Cocaine  . Sexual Activity: Not Asked   Other Topics Concern  . None   Social History Narrative   Additional Social History:    Pain Medications: see PTA meds Prescriptions: see PTA meds Over the Counter: see PTA meds History of alcohol / drug use?: Yes                    Sleep: Poor  Appetite:   Fair  Current Medications: Current Facility-Administered Medications  Medication Dose Route Frequency Provider Last Rate Last Dose  . acetaminophen (TYLENOL) tablet 650 mg  650 mg Oral Q6H PRN Gonzella Lex, MD   650 mg at 02/12/16 1403  . alum & mag hydroxide-simeth (MAALOX/MYLANTA) 200-200-20 MG/5ML suspension 30 mL  30 mL Oral Q4H PRN Gonzella Lex, MD      . chlordiazePOXIDE (LIBRIUM) capsule 25 mg  25 mg Oral QID Clovis Fredrickson, MD   25 mg at 02/12/16 1751  . magnesium hydroxide (MILK OF MAGNESIA) suspension 30 mL  30 mL Oral Daily PRN Gonzella Lex, MD      . nicotine (NICODERM CQ - dosed in mg/24 hours) patch 21 mg  21 mg Transdermal Daily Jhamir Pickup B Diron Haddon, MD   21 mg at 02/12/16 0829  . sulfamethoxazole-trimethoprim (BACTRIM DS,SEPTRA DS) 800-160 MG per tablet 2 tablet  2 tablet Oral BID AC & HS Clovis Fredrickson, MD   2 tablet at 02/12/16 0829  . thiamine (VITAMIN B-1) tablet 100 mg  100 mg Oral Q breakfast Makenley Shimp B Keaira Whitehurst, MD   100 mg at 02/12/16 0829  . traZODone (DESYREL) tablet 100 mg  100 mg Oral QHS PRN Clovis Fredrickson, MD        Lab Results:  Results for orders placed or performed during the hospital encounter of 02/11/16 (from the past 48  hour(s))  CBC with Differential/Platelet     Status: Abnormal   Collection Time: 02/10/16 11:40 PM  Result Value Ref Range   WBC 7.8 3.6 - 11.0 K/uL   RBC 4.66 3.80 - 5.20 MIL/uL   Hemoglobin 14.1 12.0 - 16.0 g/dL   HCT 41.0 35.0 - 47.0 %   MCV 87.8 80.0 - 100.0 fL   MCH 30.2 26.0 - 34.0 pg   MCHC 34.4 32.0 - 36.0 g/dL   RDW 15.2 (H) 11.5 - 14.5 %   Platelets 259 150 - 440 K/uL   Neutrophils Relative % 63 %   Neutro Abs 4.9 1.4 - 6.5 K/uL   Lymphocytes Relative 26 %   Lymphs Abs 2.0 1.0 - 3.6 K/uL   Monocytes Relative 9 %   Monocytes Absolute 0.7 0.2 - 0.9 K/uL   Eosinophils Relative 1 %   Eosinophils Absolute 0.1 0 - 0.7 K/uL   Basophils Relative 1 %   Basophils Absolute 0.1 0 - 0.1 K/uL   Comprehensive metabolic panel     Status: Abnormal   Collection Time: 02/10/16 11:40 PM  Result Value Ref Range   Sodium 137 135 - 145 mmol/L   Potassium 3.2 (L) 3.5 - 5.1 mmol/L   Chloride 104 101 - 111 mmol/L   CO2 25 22 - 32 mmol/L   Glucose, Bld 100 (H) 65 - 99 mg/dL   BUN 9 6 - 20 mg/dL   Creatinine, Ser 0.64 0.44 - 1.00 mg/dL   Calcium 8.8 (L) 8.9 - 10.3 mg/dL   Total Protein 7.8 6.5 - 8.1 g/dL   Albumin 3.7 3.5 - 5.0 g/dL   AST 22 15 - 41 U/L   ALT 13 (L) 14 - 54 U/L   Alkaline Phosphatase 68 38 - 126 U/L   Total Bilirubin 0.3 0.3 - 1.2 mg/dL   GFR calc non Af Amer >60 >60 mL/min   GFR calc Af Amer >60 >60 mL/min    Comment: (NOTE) The eGFR has been calculated using the CKD EPI equation. This calculation has not been validated in all clinical situations. eGFR's persistently <60 mL/min signify possible Chronic Kidney Disease.    Anion gap 8 5 - 15  Ethanol     Status: Abnormal   Collection Time: 02/10/16 11:40 PM  Result Value Ref Range   Alcohol, Ethyl (B) 15 (H) <5 mg/dL    Comment:        LOWEST DETECTABLE LIMIT FOR SERUM ALCOHOL IS 5 mg/dL FOR MEDICAL PURPOSES ONLY   Acetaminophen level     Status: Abnormal   Collection Time: 02/10/16 11:40 PM  Result Value Ref Range   Acetaminophen (Tylenol), Serum <10 (L) 10 - 30 ug/mL    Comment:        THERAPEUTIC CONCENTRATIONS VARY SIGNIFICANTLY. A RANGE OF 10-30 ug/mL MAY BE AN EFFECTIVE CONCENTRATION FOR MANY PATIENTS. HOWEVER, SOME ARE BEST TREATED AT CONCENTRATIONS OUTSIDE THIS RANGE. ACETAMINOPHEN CONCENTRATIONS >150 ug/mL AT 4 HOURS AFTER INGESTION AND >50 ug/mL AT 12 HOURS AFTER INGESTION ARE OFTEN ASSOCIATED WITH TOXIC REACTIONS.   Salicylate level     Status: None   Collection Time: 02/10/16 11:40 PM  Result Value Ref Range   Salicylate Lvl <7.8 2.8 - 30.0 mg/dL  Urine Drug Screen, Qualitative ([facility] only)     Status: Abnormal   Collection Time: 02/11/16 12:02 PM  Result Value Ref Range    Tricyclic, Ur Screen NONE DETECTED NONE DETECTED   Amphetamines, Ur Screen NONE DETECTED NONE DETECTED  MDMA (Ecstasy)Ur Screen NONE DETECTED NONE DETECTED   Cocaine Metabolite,Ur [place] POSITIVE (A) NONE DETECTED   Opiate, Ur Screen NONE DETECTED NONE DETECTED   Phencyclidine (PCP) Ur S NONE DETECTED NONE DETECTED   Cannabinoid 50 Ng, Ur [place] NONE DETECTED NONE DETECTED   Barbiturates, Ur Screen NONE DETECTED NONE DETECTED   Benzodiazepine, Ur Scrn NONE DETECTED NONE DETECTED   Methadone Scn, Ur NONE DETECTED NONE DETECTED    Comment: (NOTE) 549  Tricyclics, urine               Cutoff 1000 ng/mL 200  Amphetamines, urine             Cutoff 1000 ng/mL 300  MDMA (Ecstasy), urine           Cutoff 500 ng/mL 400  Cocaine Metabolite, urine       Cutoff 300 ng/mL 500  Opiate, urine                   Cutoff 300 ng/mL 600  Phencyclidine (PCP), urine      Cutoff 25 ng/mL 700  Cannabinoid, urine              Cutoff 50 ng/mL 800  Barbiturates, urine             Cutoff 200 ng/mL 900  Benzodiazepine, urine           Cutoff 200 ng/mL 1000 Methadone, urine                Cutoff 300 ng/mL 1100 1200 The urine drug screen provides only a preliminary, unconfirmed 1300 analytical test result and should not be used for non-medical 1400 purposes. Clinical consideration and professional judgment should 1500 be applied to any positive drug screen result due to possible 1600 interfering substances. A more specific alternate chemical method 1700 must be used in order to obtain a confirmed analytical result.  1800 Gas chromato graphy / mass spectrometry (GC/MS) is the preferred 1900 confirmatory method.   Urinalysis complete, with microscopic ([facility] only)     Status: Abnormal   Collection Time: 02/11/16 12:02 PM  Result Value Ref Range   Color, Urine STRAW (A) YELLOW   APPearance CLEAR (A) CLEAR   Glucose, UA NEGATIVE NEGATIVE mg/dL   Bilirubin Urine NEGATIVE NEGATIVE   Ketones, ur NEGATIVE NEGATIVE mg/dL    Specific Gravity, Urine 1.009 1.005 - 1.030   Hgb urine dipstick NEGATIVE NEGATIVE   pH 8.0 5.0 - 8.0   Protein, ur NEGATIVE NEGATIVE mg/dL   Nitrite NEGATIVE NEGATIVE   Leukocytes, UA TRACE (A) NEGATIVE   RBC / HPF 0-5 0 - 5 RBC/hpf   WBC, UA 0-5 0 - 5 WBC/hpf   Bacteria, UA RARE (A) NONE SEEN   Squamous Epithelial / LPF 0-5 (A) NONE SEEN   Mucous PRESENT   Pregnancy, urine POC     Status: None   Collection Time: 02/11/16 12:06 PM  Result Value Ref Range   Preg Test, Ur NEGATIVE NEGATIVE    Comment:        THE SENSITIVITY OF THIS METHODOLOGY IS >24 mIU/mL     Blood Alcohol level:  Lab Results  Component Value Date   ETH 15* 02/10/2016    Physical Findings: AIMS: Facial and Oral Movements Muscles of Facial Expression: None, normal Lips and Perioral Area: None, normal Jaw: None, normal Tongue: None, normal,Extremity Movements Upper (arms, wrists, hands, fingers): None, normal Lower (legs, knees, ankles, toes): None, normal, Trunk Movements Neck,  shoulders, hips: None, normal, Overall Severity Severity of abnormal movements (highest score from questions above): None, normal Incapacitation due to abnormal movements: None, normal Patient's awareness of abnormal movements (rate only patient's report): No Awareness, Dental Status Current problems with teeth and/or dentures?: No Does patient usually wear dentures?: No  CIWA:  CIWA-Ar Total: 0 COWS:     Musculoskeletal: Strength & Muscle Tone: within normal limits Gait & Station: normal Patient leans: N/A  Psychiatric Specialty Exam: Physical Exam  Nursing note and vitals reviewed.   Review of Systems  Psychiatric/Behavioral: Positive for depression, suicidal ideas and substance abuse.  All other systems reviewed and are negative.   Blood pressure 116/68, pulse 65, temperature 98.5 F (36.9 C), temperature source Oral, resp. rate 20, height '5\' 4"'  (1.626 m), weight 81.647 kg (180 lb), last menstrual period 02/10/2016,  SpO2 100 %.Body mass index is 30.88 kg/(m^2).  General Appearance: Casual  Eye Contact:  Good  Speech:  Clear and Coherent  Volume:  Normal  Mood:  Anxious, Depressed, Hopeless and Worthless  Affect:  Blunt  Thought Process:  Goal Directed  Orientation:  Full (Time, Place, and Person)  Thought Content:  WDL  Suicidal Thoughts:  Yes.  with intent/plan  Homicidal Thoughts:  No  Memory:  Immediate;   Fair Recent;   Fair Remote;   Fair  Judgement:  Poor  Insight:  Shallow  Psychomotor Activity:  Decreased  Concentration:  Concentration: Fair and Attention Span: Fair  Recall:  [employer] of Knowledge:  Fair  Language:  Fair  Akathisia:  No  Handed:  Right  AIMS (if indicated):     Assets:  Communication Skills Desire for Improvement Physical Health Resilience  ADL's:  Intact  Cognition:  WNL  Sleep:  Number of Hours: 5.5     Treatment Plan Summary: Daily contact with patient to assess and evaluate symptoms and progress in treatment and Medication management   Carly Drake is a 30 year old female with a history of bipolar depression and substance use admitted for worsening of depression and suicidal ideation in the context of relapse on substances and treatment noncompliance.  1. Suicidal ideation. The patient is able to contract for safety in the hospital.  2. Mood. We will start Trileptal for mood stabilization and Effexor for depression.  3. Insomnia. We will increase Trazodone to 200 mg.  4. Alcohol abuse. She is on Librium taper.  5. Substance abuse treatment. The patient desires treatment and social worker to consider options.  6. Cellulitis. She is on Septra.  7. Smoking. Nicotine patch is available.  8. PTSD. We will start Minipress for nightmares and flashbacks and Neurontin for anxiety.   9. Disposition. To be established.  Orson Slick, MD 02/12/2016, 7:32 PM

## 2016-02-12 NOTE — [facility] Group Notes (Signed)
[facility] Group Notes:  (Nursing/MHT/Case Management/Adjunct)  Date:  02/12/2016  Time:  10:28 PM  Type of Therapy:  Evening Wrap-up Group  Participation Level:  Active  Participation Quality:  Appropriate and Attentive  Affect:  Appropriate  Cognitive:  Alert and Appropriate  Insight:  Good and Improving  Engagement in Group:  Developing/Improving and Engaged  Modes of Intervention:  Discussion  Summary of Progress/Problems:  Carly MorrowChelsea Nanta Pinkney Venard 02/12/2016, 10:28 PM
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
## 2016-02-13 LAB — TSH: TSH: 3.507 u[IU]/mL (ref 0.350–4.500)

## 2016-02-13 MED ORDER — OXCARBAZEPINE 300 MG PO TABS
150.0000 mg | ORAL_TABLET | Freq: Two times a day (BID) | ORAL | Status: DC
  Administered 2016-02-13 – 2016-02-14 (×2): 150 mg via ORAL
  Filled 2016-02-13 (×2): qty 1

## 2016-02-13 MED ORDER — GABAPENTIN 300 MG PO CAPS
300.0000 mg | ORAL_CAPSULE | Freq: Three times a day (TID) | ORAL | Status: DC
  Administered 2016-02-13 – 2016-02-17 (×12): 300 mg via ORAL
  Filled 2016-02-13 (×12): qty 1

## 2016-02-13 MED ORDER — PRAZOSIN HCL 2 MG PO CAPS
2.0000 mg | ORAL_CAPSULE | Freq: Two times a day (BID) | ORAL | Status: DC
  Administered 2016-02-13 – 2016-02-17 (×8): 2 mg via ORAL
  Filled 2016-02-13 (×8): qty 1

## 2016-02-13 MED ORDER — VENLAFAXINE HCL ER 75 MG PO CP24
150.0000 mg | ORAL_CAPSULE | Freq: Every day | ORAL | Status: DC
  Administered 2016-02-14 – 2016-02-17 (×4): 150 mg via ORAL
  Filled 2016-02-13 (×4): qty 2

## 2016-02-13 MED ORDER — TRAZODONE HCL 100 MG PO TABS
200.0000 mg | ORAL_TABLET | Freq: Every evening | ORAL | Status: DC | PRN
  Administered 2016-02-13 – 2016-02-16 (×4): 200 mg via ORAL
  Filled 2016-02-13 (×4): qty 2

## 2016-02-13 NOTE — Plan of Care (Signed)
Problem: [facility] Participation in Recreation Therapeutic Interventions Goal: STG-Patient will demonstrate improved self esteem by identif STG: Self-Esteem - Within 4 treatment sessions, patient will verbalize at least 5 positive affirmation statements in each of 2 treatment sessions to increase self-esteem post d/c.  Outcome: Progressing Treatment Session 1; Completed 1 out of 2: At approximately 3:00 pm, LRT met with patient in consultation room. Patient verbalized 5 positive affirmation statements. Patient reported it felt "good". LRT encouraged patient to continue saying positive affirmation statements.  Leonette Monarch, LRT/CTRS 06.15.17 3:40 pm Goal: STG-Patient will identify at least five coping skills for ** STG: Coping Skills - Within 4 treatment sessions, patient will verbalize at least 5 coping skills for substance abuse in each of 2 treatment sessions to decrease substance abuse post d/c.  Outcome: Progressing Treatment Session 1; Completed 1 out of 2: At approximately 3:00 pm, LRT met with patient in consultation room. Patient verbalized 5 coping skills for substance abuse. LRT educated patient on leisure and why it is important to implement it into her schedule. LRT educated and provided patient with blank schedules to help her plan her day and try to avoid using substances. LRT educated patient on healthy support systems.  Leonette Monarch, LRT/CTRS 06.15.17 3:42 pm

## 2016-02-13 NOTE — Progress Notes (Signed)
Recreation Therapy Notes  Date: 06.15.17 Time: 1:00 pm Location: Craft Room  Group Topic: Leisure Education  Goal Area(s) Addresses:  Patient will identify activities for each letter of the alphabet. Patient will verbalize ability to integrate positive leisure into life post d/c. Patient will verbalize ability to use leisure as a Associate Professorcoping skill.  Behavioral Response: Attentive, Interactive, Inappropriate  Intervention: Leisure Alphabet  Activity: Patients were given a Leisure Information systems managerAlphabet worksheet and instructed to identify leisure activities for each letter of the alphabet.  Education: LRT educated patients on what they need to participate in leisure.  Education Outcome: In group clarification offered  Clinical Observations/Feedback: Patient completed activity by writing leisure activities for each letter of the alphabet. Patient talked about drugs and selling herself on back page. LRT attempted to redirect patient multiple time. Patient difficult to redirect. Patient contributed to group discussion by stating some healthy leisure activities, and what she needs to participate in leisure. Patient was focused on drugs during group discussion.  Jacquelynn CreeGreene,Raegen Tarpley M, LRT/CTRS 02/13/2016 2:38 PM

## 2016-02-13 NOTE — Plan of Care (Signed)
Problem: Coping: Goal: Ability to cope will improve Outcome: Not Progressing Patient not able to cope as she has not learned coping mechanisms as of yet this shift Tax adviserCTownsend RN

## 2016-02-13 NOTE — Progress Notes (Signed)
D: Patient is alert and oriented on the unit this shift. Patient attended   groups today. Patient denies suicidal ideation, homicidal ideation, auditory or visual hallucinations at the present time.  A: Scheduled medications are administered to patient as per MD orders. Emotional support and encouragement are provided. Patient is maintained on q.15 minute safety checks. Patient is informed to notify staff with questions or concerns. R: No adverse medication reactions are noted. Patient is cooperative with medication administration and treatment plan today. Patient is receptive,  anxious and cooperative on the unit at this time. Patient does interact  with others on the unit this shift. Patient contracts for safety at this time. Patient remains safe at this time.

## 2016-02-13 NOTE — [facility] Group Notes (Signed)
[facility] Group Notes:  (Nursing/MHT/Case Management/Adjunct)  Date:  02/13/2016  Time:  3:58 PM  Type of Therapy:  Movement Therapy  Participation Level:  Active  Participation Quality:  Appropriate  Affect:  Appropriate  Cognitive:  Alert and Appropriate  Insight:  Good and Improving  Engagement in Group:  Engaged and Supportive  Modes of Intervention:  Role-play  Summary of Progress/Problems:  Carly Drake 02/13/2016, 3:58 PM
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
## 2016-02-13 NOTE — [facility] Group Notes (Signed)
[facility] Group Notes:  (Nursing/MHT/Case Management/Adjunct)  Date:  02/13/2016  Time:  9:53 PM  Type of Therapy:  Group Therapy  Participation Level:  Active  Participation Quality:  Appropriate and Attentive  Affect:  Appropriate  Cognitive:  Alert  Insight:  Appropriate  Engagement in Group:  Engaged  Modes of Intervention:  Discussion  Summary of Progress/Problems: Carly Drake was talkative during group but was also very supportive of her peers. Her goal was to recite positive affirmations about her self and she stated that she was able to accomplish that goal.  Carly Drake Carly Drake 02/13/2016, 9:53 PM

## 2016-02-13 NOTE — Plan of Care (Signed)
Problem: Coping: Goal: Ability to cope will improve Outcome: Not Progressing Upset crying  Yelling out at family on phone cursing

## 2016-02-14 MED ORDER — TUBERCULIN PPD 5 UNIT/0.1ML ID SOLN
5.0000 [IU] | Freq: Once | INTRADERMAL | Status: AC
  Administered 2016-02-14: 5 [IU] via INTRADERMAL
  Filled 2016-02-14: qty 0.1

## 2016-02-14 MED ORDER — TRAZODONE HCL 100 MG PO TABS: 200.0000 mg | ORAL_TABLET | Freq: Every evening | ORAL | Status: DC | PRN

## 2016-02-14 MED ORDER — GABAPENTIN 300 MG PO CAPS: 300.0000 mg | ORAL_CAPSULE | Freq: Three times a day (TID) | ORAL | Status: DC

## 2016-02-14 MED ORDER — VENLAFAXINE HCL ER 150 MG PO CP24: 150.0000 mg | ORAL_CAPSULE | Freq: Every day | ORAL | Status: DC

## 2016-02-14 MED ORDER — OXCARBAZEPINE 300 MG PO TABS: 300.0000 mg | ORAL_TABLET | Freq: Two times a day (BID) | ORAL | Status: DC

## 2016-02-14 MED ORDER — MELOXICAM 15 MG PO TABS: 15.0000 mg | ORAL_TABLET | Freq: Every day | ORAL | Status: DC

## 2016-02-14 MED ORDER — PRAZOSIN HCL 2 MG PO CAPS: 2.0000 mg | ORAL_CAPSULE | Freq: Two times a day (BID) | ORAL | Status: DC

## 2016-02-14 MED ORDER — MELOXICAM 7.5 MG PO TABS
15.0000 mg | ORAL_TABLET | Freq: Every day | ORAL | Status: DC
  Administered 2016-02-14 – 2016-02-17 (×4): 15 mg via ORAL
  Filled 2016-02-14 (×4): qty 2

## 2016-02-14 MED ORDER — OXCARBAZEPINE 300 MG PO TABS
300.0000 mg | ORAL_TABLET | Freq: Two times a day (BID) | ORAL | Status: DC
  Administered 2016-02-14 – 2016-02-17 (×6): 300 mg via ORAL
  Filled 2016-02-14 (×6): qty 1

## 2016-02-14 MED ORDER — SULFAMETHOXAZOLE-TRIMETHOPRIM 800-160 MG PO TABS: 2.0000 | ORAL_TABLET | Freq: Two times a day (BID) | ORAL | Status: DC

## 2016-02-14 NOTE — [facility] Group Notes (Signed)
[facility] Group Notes:  (Nursing/MHT/Case Management/Adjunct)  Date:  02/14/2016  Time:  4:47 PM  Type of Therapy:  Psychoeducational Skills  Participation Level:  Minimal  Participation Quality:  Attentive and Supportive  Affect:  Flat  Cognitive:  Appropriate  Insight:  Appropriate  Engagement in Group:  Monopolizing and Off Topic  Modes of Intervention:  Discussion and Education  Summary of Progress/Problems:  Carly Drake 02/14/2016, 4:47 PM

## 2016-02-14 NOTE — Progress Notes (Signed)
Client has been interacting with other clients in the dayroom this PM; stating that, "I want to get better; I know that; i take  A lot of medicine; I need it to get better; I'm going to show my family that; I'm trying to get better." Client denies having any S.I./H.I.; and denies having any A/V hallucinations or delusions.

## 2016-02-14 NOTE — Tx Team (Signed)
Interdisciplinary Treatment Plan Update (Adult)  Date:  02/14/2016 Time Reviewed:  4:52 PM  Progress in Treatment: Attending groups: Yes. Participating in groups:  Yes. Taking medication as prescribed:  Yes. Tolerating medication:  Yes. Family/Significant othe contact made:  No, will contact:  Pt refused  Patient understands diagnosis:  Yes. Discussing patient identified problems/goals with staff:  Yes. Medical problems stabilized or resolved:  Yes. Denies suicidal/homicidal ideation: Yes. Issues/concerns per patient self-inventory:  Yes. Other:  New problem(s) identified: No, Describe:  NA  Discharge Plan or Barriers: Pt will be going to [facility] upon discharge.   Reason for Continuation of Hospitalization: Anxiety Depression Medication stabilization Suicidal ideation Withdrawal symptoms  Comments:Carly Drake is a 30 year old female with history of bipolar disorder and substance admitted for suicidal ideation. We started her on Trileptal for mood stabilization and Effexor for depression. She completed Librium taper. There is a chance that she will have a bad at [facility] on Monday. Today the patient feels somewhat sedated from medications but otherwise is okay. She still has passing thoughts of suicide especially if discharged to a safe environment. Social worker has been working very hard to find her a rehabilitation facility. She started complaining of back pain today and we started Mobic. There is good group participation. She is out of her room and interact appropriately with and peers.  Estimated length of stay: Pt will likely d/c on Monday.   New goal(s): NA  Review of initial/current patient goals per problem list:   1.  Goal(s): Patient will participate in aftercare plan * Met:  * Target date: at discharge * As evidenced by: Patient will participate within aftercare plan AEB aftercare provider and housing plan at discharge being identified.   2.  Goal (s): Patient will  exhibit decreased depressive symptoms and suicidal ideations. * Met:  *  Target date: at discharge * As evidenced by: Patient will utilize self rating of depression at 3 or below and demonstrate decreased signs of depression or be deemed stable for discharge by MD.   3.  Goal(s): Patient will demonstrate decreased signs and symptoms of anxiety. * Met:  * Target date: at discharge * As evidenced by: Patient will utilize self rating of anxiety at 3 or below and demonstrated decreased signs of anxiety, or be deemed stable for discharge by MD   4.  Goal(s): Patient will demonstrate decreased signs of withdrawal due to substance abuse * Met:  * Target date: at discharge * As evidenced by: Patient will produce a CIWA/COWS score of 0, have stable vitals signs, and no symptoms of withdrawal.   Attendees: Patient:  Carly Drake 6/16/20174:52 PM  Family:   6/16/20174:52 PM  Physician:   Dr. Bary Leriche  6/16/20174:52 PM  Nursing:   Hulan Amato, RN  6/16/20174:52 PM  Case Manager:   6/16/20174:52 PM  Counselor:   6/16/20174:52 PM  Other:  Wray Kearns, [place] 6/16/20174:52 PM  Other:  Everitt Amber, [place]  6/16/20174:52 PM  Other:   6/16/20174:52 PM  Other:  6/16/20174:52 PM  Other:  6/16/20174:52 PM  Other:  6/16/20174:52 PM  Other:  6/16/20174:52 PM  Other:  6/16/20174:52 PM  Other:  6/16/20174:52 PM  Other:   6/16/20174:52 PM   Scribe for Treatment Team:   Wray Kearns, MSW, [place]  02/14/2016, 4:52 PM

## 2016-02-14 NOTE — Progress Notes (Signed)
Recreation Therapy Notes  Date: 06.16.17 Time: 9:30 am Location: Craft Room  Group Topic: Coping Skills  Goal Area(s) Addresses:  Patient will participate in healthy coping skill. Patient will verbalize using art as a coping skill.  Behavioral Response: Attentive, Interactive  Intervention: Coloring  Activity: Patients were given coloring sheets to color and were instructed to think about what their mind was focus on and what emotions they felt.  Education: LRT educated patients on healthy coping skills.  Education Outcome: Acknowledges education/In group clarification offered  Clinical Observations/Feedback: Patient colored coloring sheet. Patient contributed to group discussion by stating how coloring is a good coping skills. Patient stated what emotions she felt and what her mind was focused on while coloring.  Jacquelynn CreeGreene,Dennison Mcdaid M, LRT/CTRS 02/14/2016 10:28 AM

## 2016-02-14 NOTE — Progress Notes (Signed)
Patient states that she slept fairly last night. Verbalizes good appetite, low energy and poor concentration. Patient endorses feeling depressed, hopeless and anxious. Patients states that she is still experiencing some withdrawal symptoms(cravings, diarrhea, agitation, and irritability). Patient endorses passive SI but contracts for safety. Patient also complained of pain and PRN Tylenol administered with slight relief verbalized. Patient attended groups and remained cooperative with treatment/medication regimen.

## 2016-02-14 NOTE — Plan of Care (Signed)
Problem: [facility] Participation in Recreation Therapeutic Interventions Goal: STG-Patient will demonstrate improved self esteem by identif STG: Self-Esteem - Within 4 treatment sessions, patient will verbalize at least 5 positive affirmation statements in each of 2 treatment sessions to increase self-esteem post d/c.  Outcome: Completed/Met Date Met:  02/14/16 Treatment Session 2; Completed 2 out of 2: At approximately 3:55 pm, LRT met with patient in patient room. Patient verbalized 5 positive affirmation statements. Patient reported it felt "good real good". LRT encouraged patient to continue saying positive affirmation statements.  Leonette Monarch, LRT/CTRS 06.16.17 5:33 pm Goal: STG-Patient will identify at least five coping skills for ** STG: Coping Skills - Within 4 treatment sessions, patient will verbalize at least 5 coping skills for substance abuse in each of 2 treatment sessions to decrease substance abuse post d/c.  Outcome: Completed/Met Date Met:  02/14/16 Treatment Session 2; Completed 2 out of 2: At approximately 3:55 pm, LRT met with patient in patient room. Patient verbalized 5 coping skills for substance abuse. LRT encouraged patient to participate in leisure activities.  Leonette Monarch, LRT/CTRS 06.16.17 5:34 pm

## 2016-02-14 NOTE — Progress Notes (Addendum)
[facility] MD Progress Note  02/14/2016 2:23 PM Jouri Threat  MRN:  [id number]  Subjective:  Carly Drake is a 30 year old female with history of bipolar disorder and substance admitted for suicidal ideation. We started her on Trileptal for mood stabilization and Effexor for depression. She completed Librium taper. There is a chance that she will have a bad at [facility] on Monday.  Today the patient feels somewhat sedated from medications but otherwise is okay. She still has passing thoughts of suicide especially if discharged to a safe environment. Social worker has been working very hard to find her a rehabilitation facility. She started complaining of back pain today and we started Mobic. There is good group participation. She is out of her room and interact appropriately with and peers.  Principal Problem: Major depressive disorder, recurrent severe without psychotic features (HCC) Diagnosis:   Patient Active Problem List   Diagnosis Date Noted  . Cellulitis [L03.90] 02/11/2016  . Major depressive disorder, recurrent severe without psychotic features (HCC) [F33.2] 02/11/2016  . Alcohol use disorder, moderate, dependence (HCC) [F10.20] 02/11/2016  . Cocaine use disorder, moderate, dependence (HCC) [F14.20] 02/11/2016  . Tobacco use disorder [F17.200] 02/11/2016   Total Time spent with patient: 20 minutes  Past Psychiatric History: Depression, mood instability, substance use.  Past Medical History:  Past Medical History  Diagnosis Date  . Pilonidal cyst     Past Surgical History  Procedure Laterality Date  . Cesarean section     Family History: History reviewed. No pertinent family history. Family Psychiatric  History: See H&P. Social History:  History  Alcohol Use  . Yes     History  Drug Use  . Yes  . Special: Cocaine    Social History   Social History  . Marital Status: Single    Spouse Name: N/A  . Number of Children: N/A  . Years of Education: N/A   Social History Main  Topics  . Smoking status: Current Every Day Smoker -- 1.00 packs/day    Types: Cigarettes  . Smokeless tobacco: Current User  . Alcohol Use: Yes  . Drug Use: Yes    Special: Cocaine  . Sexual Activity: Not Asked   Other Topics Concern  . None   Social History Narrative   Additional Social History:    Pain Medications: see PTA meds Prescriptions: see PTA meds Over the Counter: see PTA meds History of alcohol / drug use?: Yes                    Sleep: Fair  Appetite:  Fair  Current Medications: Current Facility-Administered Medications  Medication Dose Route Frequency Provider Last Rate Last Dose  . acetaminophen (TYLENOL) tablet 650 mg  650 mg Oral Q6H PRN Audery Amel, MD   650 mg at 02/14/16 1125  . alum & mag hydroxide-simeth (MAALOX/MYLANTA) 200-200-20 MG/5ML suspension 30 mL  30 mL Oral Q4H PRN Audery Amel, MD      . chlordiazePOXIDE (LIBRIUM) capsule 25 mg  25 mg Oral QID Shari Prows, MD   25 mg at 02/14/16 1358  . gabapentin (NEURONTIN) capsule 300 mg  300 mg Oral TID Shari Prows, MD   300 mg at 02/14/16 0900  . magnesium hydroxide (MILK OF MAGNESIA) suspension 30 mL  30 mL Oral Daily PRN Audery Amel, MD      . meloxicam (MOBIC) tablet 15 mg  15 mg Oral Daily Shari Prows, MD      .  nicotine (NICODERM CQ - dosed in mg/24 hours) patch 21 mg  21 mg Transdermal Daily Sanam Marmo B Angelin Cutrone, MD   21 mg at 02/14/16 0858  . Oxcarbazepine (TRILEPTAL) tablet 300 mg  300 mg Oral BID Faizan Geraci B Yehoshua Vitelli, MD      . prazosin (MINIPRESS) capsule 2 mg  2 mg Oral BID Avari Nevares B Johann Santone, MD   2 mg at 02/14/16 0900  . sulfamethoxazole-trimethoprim (BACTRIM DS,SEPTRA DS) 800-160 MG per tablet 2 tablet  2 tablet Oral BID AC & HS Shari Prows, MD   2 tablet at 02/14/16 0859  . thiamine (VITAMIN B-1) tablet 100 mg  100 mg Oral Q breakfast Rahel Carlton B Marlo Arriola, MD   100 mg at 02/14/16 0859  . traZODone (DESYREL) tablet 200 mg  200 mg Oral QHS  PRN Shari Prows, MD   200 mg at 02/13/16 2139  . venlafaxine XR (EFFEXOR-XR) 24 hr capsule 150 mg  150 mg Oral Q breakfast Mahsa Hanser B Pesach Frisch, MD   150 mg at 02/14/16 0900    Lab Results:  Results for orders placed or performed during the hospital encounter of 02/12/16 (from the past 48 hour(s))  TSH     Status: None   Collection Time: 02/13/16  7:04 AM  Result Value Ref Range   TSH 3.507 0.350 - 4.500 uIU/mL    Blood Alcohol level:  Lab Results  Component Value Date   [facility] 15* 02/10/2016    Physical Findings: AIMS: Facial and Oral Movements Muscles of Facial Expression: None, normal Lips and Perioral Area: None, normal Jaw: None, normal Tongue: None, normal,Extremity Movements Upper (arms, wrists, hands, fingers): None, normal Lower (legs, knees, ankles, toes): None, normal, Trunk Movements Neck, shoulders, hips: None, normal, Overall Severity Severity of abnormal movements (highest score from questions above): None, normal Incapacitation due to abnormal movements: None, normal Patient's awareness of abnormal movements (rate only patient's report): No Awareness, Dental Status Current problems with teeth and/or dentures?: No Does patient usually wear dentures?: No  CIWA:  CIWA-Ar Total: 5 COWS:  COWS Total Score: 0  Musculoskeletal: Strength & Muscle Tone: within normal limits Gait & Station: normal Patient leans: N/A  Psychiatric Specialty Exam: Physical Exam  Nursing note and vitals reviewed.   Review of Systems  Musculoskeletal: Positive for back pain.  Psychiatric/Behavioral: Positive for depression, suicidal ideas and substance abuse. The patient is nervous/anxious.   All other systems reviewed and are negative.   Blood pressure 109/65, pulse 69, temperature 98.1 F (36.7 C), temperature source Oral, resp. rate 18, height 5\' 4"  (1.626 m), weight 81.647 kg (180 lb), last menstrual period 02/10/2016, SpO2 100 %.Body mass index is 30.88 kg/(m^2).   General Appearance: Casual  Eye Contact:  Good  Speech:  Clear and Coherent  Volume:  Normal  Mood:  Depressed, Hopeless and Worthless  Affect:  Appropriate  Thought Process:  Goal Directed  Orientation:  Full (Time, Place, and Person)  Thought Content:  WDL  Suicidal Thoughts:  Yes.  with intent/plan  Homicidal Thoughts:  No  Memory:  Immediate;   Fair Recent;   Fair Remote;   Fair  Judgement:  Poor  Insight:  Lacking  Psychomotor Activity:  Normal  Concentration:  Concentration: Fair and Attention Span: Fair  Recall:  [employer] of Knowledge:  Fair  Language:  Fair  Akathisia:  No  Handed:  Right  AIMS (if indicated):     Assets:  Communication Skills Desire for Improvement Physical Health Resilience Social Support  ADL's:  Intact  Cognition:  WNL  Sleep:  Number of Hours: 7.15     Treatment Plan Summary: Daily contact with patient to assess and evaluate symptoms and progress in treatment and Medication management   [name] is a 30 year old female with a history of bipolar depression and substance use admitted for worsening of depression and suicidal ideation in the context of relapse on substances and treatment noncompliance.  1. Suicidal ideation. The patient is able to contract for safety in the hospital.  2. Mood. We will start Trileptal for mood stabilization and Effexor for depression.  3. Insomnia. We increase Trazodone to 200 mg.  4. Alcohol abuse. She completed Librium taper.  5. Substance abuse treatment. The patient desires treatment. [facility] PPD placed.  6. Cellulitis. She is on Septra.  7. Smoking. Nicotine patch is available.  8. PTSD. We will start Minipress for nightmares and flashbacks and Neurontin for anxiety.   9. Back pain. We started Mobic.   10. Disposition. To be established.  Kristine LineaJolanta Shaquina Gillham, MD 02/14/2016, 2:23 PM

## 2016-02-14 NOTE — Plan of Care (Signed)
Problem: Health Behavior/Discharge Planning: Goal: Compliance with treatment plan for underlying cause of condition will improve Outcome: Progressing Patient attended groups and remained cooperative to medication regimen.

## 2016-02-14 NOTE — [facility] Group Notes (Signed)
[facility] LCSW Group Therapy  02/14/2016 5:15 PM  Type of Therapy:  Group Therapy  Participation Level:  Minimal  Participation Quality:  Attentive  Affect:  Appropriate  Cognitive:  Alert  Insight:  Limited   Engagement in Therapy:  Limited   Modes of Intervention:  Discussion, Education, Socialization and Support  Summary of Progress/Problems: Feelings around Relapse. Group members discussed the meaning of relapse and shared personal stories of relapse, how it affected them and others, and how they perceived themselves during this time. Group members were encouraged to identify triggers, warning signs and coping skills used when facing the possibility of relapse. Social supports were discussed and explored in detail. Pt attended group and stayed the entire time. Pt sat quietly and listened to other group members share.    Carly Drake L Cato Liburd /MSW, LCSWA  02/14/2016, 5:15 PM    
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
## 2016-02-15 ENCOUNTER — Other Ambulatory Visit: Payer: Self-pay

## 2016-02-15 NOTE — [facility] Group Notes (Signed)
[facility] LCSW Group Therapy  02/15/2016 2:28 PM  Type of Therapy:  Group Therapy  Participation Level:  Minimal  Participation Quality:  Inattentive  Affect:  Flat  Cognitive:  Alert  Insight:  Limited  Engagement in Therapy:  Limited  Modes of Intervention:  Discussion, Education, Socialization and Support  Summary of Progress/Problems: Pt will identify unhealthy thoughts and how they impact their emotions and behavior. Pt will be encouraged to discuss these thoughts, emotions and behaviors with the group. Pt attended group and stayed the entire time. Pt sat quietly and listened to other group members share.   Carly Drake L Kevan Prouty MSW, LCSWA  02/15/2016, 2:28 PM   

## 2016-02-15 NOTE — [facility] Group Notes (Signed)
[facility] Group Notes:  (Nursing/MHT/Case Management/Adjunct)  Date:  02/15/2016  Time:  1:25 AM  Type of Therapy:  Psychoeducational Skills  Participation Level:  Active  Participation Quality:  Inattentive and Monopolizing  Affect:  Excited and Not Congruent  Cognitive:  Oriented and Lacking  Insight:  Limited  Engagement in Group:  Distracting, Monopolizing and Off Topic  Modes of Intervention:  Confrontation, Discussion, Education and Exploration  Summary of Progress/Problems:  Carly Drake [name] 02/15/2016, 1:25 AM

## 2016-02-15 NOTE — Progress Notes (Signed)
Patient was at the courtyard for group when she complained to staff that she was having chest pain. VS obtained(BP=99/58, HR=86, Temp.=98.5, O2 Sats=98% and Resp.=18). MD notified and STAT EKG ordered and done(Normal sinus rhythm noted). Pain medication administered, patient repositioned, encouraged to deep breath, encouraged hydration and reassured.

## 2016-02-15 NOTE — Progress Notes (Signed)
[facility] MD Progress Note  02/15/2016 10:22 AM Carly Drake  MRN:  [id number]  Subjective:  Carly Drake is a 30 year old female with history of bipolar disorder and substance admitted for suicidal ideation. We started her on Trileptal for mood stabilization and Effexor for depression. She completed Librium taper. There is a chance that she will have a bad at [facility] on Monday.  Patient tells me she is feeling better she is not suicidal today but was having suicidal thoughts the day before. She has been is sleeping and eating well. Denies HI or having auditory or visual hallucinations. As far as side effects she denies having any as far as physical complaints she has been complaining of back pain.  Principal Problem: Major depressive disorder, recurrent severe without psychotic features (HCC) Diagnosis:   Patient Active Problem List   Diagnosis Date Noted  . Cellulitis [L03.90] 02/11/2016  . Major depressive disorder, recurrent severe without psychotic features (HCC) [F33.2] 02/11/2016  . Alcohol use disorder, moderate, dependence (HCC) [F10.20] 02/11/2016  . Cocaine use disorder, moderate, dependence (HCC) [F14.20] 02/11/2016  . Tobacco use disorder [F17.200] 02/11/2016   Total Time spent with patient: 20 minutes  Past Psychiatric History: Depression, mood instability, substance use.  Past Medical History:  Past Medical History  Diagnosis Date  . Pilonidal cyst     Past Surgical History  Procedure Laterality Date  . Cesarean section     Family History: History reviewed. No pertinent family history. Family Psychiatric  History: See H&P. Social History:  History  Alcohol Use  . Yes     History  Drug Use  . Yes  . Special: Cocaine    Social History   Social History  . Marital Status: Single    Spouse Name: N/A  . Number of Children: N/A  . Years of Education: N/A   Social History Main Topics  . Smoking status: Current Every Day Smoker -- 1.00 packs/day    Types: Cigarettes   . Smokeless tobacco: Current User  . Alcohol Use: Yes  . Drug Use: Yes    Special: Cocaine  . Sexual Activity: Not Asked   Other Topics Concern  . None   Social History Narrative   Additional Social History:    Pain Medications: see PTA meds Prescriptions: see PTA meds Over the Counter: see PTA meds History of alcohol / drug use?: Yes                    Sleep: Fair  Appetite:  Fair  Current Medications: Current Facility-Administered Medications  Medication Dose Route Frequency Provider Last Rate Last Dose  . acetaminophen (TYLENOL) tablet 650 mg  650 mg Oral Q6H PRN Audery Amel, MD   650 mg at 02/14/16 1125  . alum & mag hydroxide-simeth (MAALOX/MYLANTA) 200-200-20 MG/5ML suspension 30 mL  30 mL Oral Q4H PRN Audery Amel, MD      . gabapentin (NEURONTIN) capsule 300 mg  300 mg Oral TID Shari Prows, MD   300 mg at 02/15/16 0854  . magnesium hydroxide (MILK OF MAGNESIA) suspension 30 mL  30 mL Oral Daily PRN Audery Amel, MD      . meloxicam (MOBIC) tablet 15 mg  15 mg Oral Daily Jolanta B Pucilowska, MD   15 mg at 02/15/16 0854  . nicotine (NICODERM CQ - dosed in mg/24 hours) patch 21 mg  21 mg Transdermal Daily Shari Prows, MD   21 mg at 02/15/16 0852  . Oxcarbazepine (  TRILEPTAL) tablet 300 mg  300 mg Oral BID Shari Prows, MD   300 mg at 02/15/16 0855  . prazosin (MINIPRESS) capsule 2 mg  2 mg Oral BID Shari Prows, MD   2 mg at 02/15/16 0854  . sulfamethoxazole-trimethoprim (BACTRIM DS,SEPTRA DS) 800-160 MG per tablet 2 tablet  2 tablet Oral BID AC & HS Shari Prows, MD   2 tablet at 02/15/16 0854  . thiamine (VITAMIN B-1) tablet 100 mg  100 mg Oral Q breakfast Jolanta B Pucilowska, MD   100 mg at 02/15/16 0854  . traZODone (DESYREL) tablet 200 mg  200 mg Oral QHS PRN Shari Prows, MD   200 mg at 02/14/16 2140  . tuberculin injection 5 Units  5 Units Intradermal Once Shari Prows, MD   5 Units at  02/14/16 1746  . venlafaxine XR (EFFEXOR-XR) 24 hr capsule 150 mg  150 mg Oral Q breakfast Jolanta B Pucilowska, MD   150 mg at 02/15/16 0854    Lab Results:  No results found for this or any previous visit (from the past 48 hour(s)).  Blood Alcohol level:  Lab Results  Component Value Date   [facility] 15* 02/10/2016    Physical Findings: AIMS: Facial and Oral Movements Muscles of Facial Expression: None, normal Lips and Perioral Area: None, normal Jaw: None, normal Tongue: None, normal,Extremity Movements Upper (arms, wrists, hands, fingers): None, normal Lower (legs, knees, ankles, toes): None, normal, Trunk Movements Neck, shoulders, hips: None, normal, Overall Severity Severity of abnormal movements (highest score from questions above): None, normal Incapacitation due to abnormal movements: None, normal Patient's awareness of abnormal movements (rate only patient's report): No Awareness, Dental Status Current problems with teeth and/or dentures?: No Does patient usually wear dentures?: No  CIWA:  CIWA-Ar Total: 5 COWS:  COWS Total Score: 0  Musculoskeletal: Strength & Muscle Tone: within normal limits Gait & Station: normal Patient leans: N/A  Psychiatric Specialty Exam: Physical Exam  Nursing note and vitals reviewed.   Review of Systems  Musculoskeletal: Positive for back pain.  Psychiatric/Behavioral: Positive for depression, suicidal ideas and substance abuse. The patient is nervous/anxious.   All other systems reviewed and are negative.   Blood pressure 114/67, pulse 90, temperature 98.2 F (36.8 C), temperature source Oral, resp. rate 18, height  (1.626 m), weight 81.647 kg (180 lb), last menstrual period 02/10/2016, SpO2 100 %.Body mass index is 30.88 kg/(m^2).  General Appearance: Casual  Eye Contact:  Good  Speech:  Clear and Coherent  Volume:  Normal  Mood:  Depressed, Hopeless and Worthless  Affect:  Appropriate  Thought Process:  Goal Directed   Orientation:  Full (Time, Place, and Person)  Thought Content:  WDL  Suicidal Thoughts:  Yes.  with intent/plan  Homicidal Thoughts:  No  Memory:  Immediate;   Fair Recent;   Fair Remote;   Fair  Judgement:  Poor  Insight:  Lacking  Psychomotor Activity:  Normal  Concentration:  Concentration: Fair and Attention Span: Fair  Recall:  [employer] of Knowledge:  Fair  Language:  Fair  Akathisia:  No  Handed:  Right  AIMS (if indicated):     Assets:  Communication Skills Desire for Improvement Physical Health Resilience Social Support  ADL's:  Intact  Cognition:  WNL  Sleep:  Number of Hours: 7     Treatment Plan Summary: Daily contact with patient to assess and evaluate symptoms and progress in treatment and Medication management  Carly Drake is a 30 year old female with a history of bipolar depression and substance use admitted for worsening of depression and suicidal ideation in the context of relapse on substances and treatment noncompliance.  1. Suicidal ideation. The patient is able to contract for safety in the hospital.  2. Mood. We will start Trileptal for mood stabilization and Effexor for depression.  3. Insomnia. We increase Trazodone to 200 mg.  4. Alcohol abuse. She completed Librium taper.  5. Substance abuse treatment. The patient desires treatment. [facility]. PPD placed.  6. Cellulitis. She is on Septra.  7. Smoking. Nicotine patch is available.  8. PTSD. We will start Minipress for nightmares and flashbacks and Neurontin for anxiety.   9. Back pain. We started Mobic.   10. Disposition. To be established.  Patient reports she is improving and she does not have any concerns today. I will not make any changes to current medication regimen.  Jimmy FootmanHernandez-Gonzalez,  Tula Schryver, MD 02/15/2016, 10:22 AM

## 2016-02-15 NOTE — [facility] Group Notes (Signed)
[facility] Group Notes:  (Nursing/MHT/Case Management/Adjunct)  Date:  02/15/2016  Time:  10:48 PM  Type of Therapy:  Psychoeducational Skills  Participation Level:  Active  Participation Quality:  Appropriate and Attentive  Affect:  Appropriate  Cognitive:  Appropriate  Insight:  Appropriate and Good  Engagement in Group:  Engaged  Modes of Intervention:  Discussion, Socialization and Support  Summary of Progress/Problems:  Carly MilroyLaquanda Y [name] 02/15/2016, 10:48 PM

## 2016-02-15 NOTE — Plan of Care (Signed)
Problem: Health Behavior/Discharge Planning: Goal: Compliance with treatment plan for underlying cause of condition will improve Outcome: Progressing Patient attended group and is cooperative with medication regimen.

## 2016-02-16 MED ORDER — ACETAMINOPHEN 500 MG PO TABS: 1000.0000 mg | ORAL_TABLET | Freq: Four times a day (QID) | ORAL | Status: DC | PRN

## 2016-02-16 MED ORDER — CYCLOBENZAPRINE HCL 5 MG PO TABS
7.5000 mg | ORAL_TABLET | Freq: Three times a day (TID) | ORAL | Status: DC | PRN
  Administered 2016-02-16: 7.5 mg via ORAL
  Filled 2016-02-16 (×2): qty 1.5

## 2016-02-16 NOTE — [facility] Group Notes (Signed)
[facility] Group Notes:  (Nursing/MHT/Case Management/Adjunct)  Date:  02/16/2016  Time:  8:51 PM  Type of Therapy:  Psychoeducational Skills  Participation Level:  Active  Participation Quality:  Appropriate  Affect:  Appropriate  Cognitive:  Appropriate  Insight:  Appropriate and Good  Engagement in Group:  Engaged  Modes of Intervention:  Discussion, Education, Exploration and Support  Summary of Progress/Problems:  Carly PikeJamila A [name] 02/16/2016, 8:51 PM

## 2016-02-16 NOTE — Progress Notes (Signed)
[facility]  02/16/2016 9:56 AM Carly Drake  MRN:  [id number]  Subjective:  [name] is a 30 year old female with history of bipolar disorder and substance admitted for suicidal ideation. We started her on Trileptal for mood stabilization and Effexor for depression. She completed Librium taper. There is a [facility].  6/1 7Patient tells me she is feeling better she is not suicidal today but was having suicidal thoughts the day before. She has been is sleeping and eating well. Denies HI or having auditory or visual hallucinations. As far as side effects she denies having any as far as physical complaints she has been complaining of back pain.  6/18. She denies suicidality. States she is feeling excited about her discharge plan to go to a residential treatment for substance abuse on Monday. She denies suicidality, homicidality or having auditory or visual hallucinations. Her biggest concern denied that she's been having back pain. She is in agreement with taking Flexeril as needed. Patient is tolerating medications quite well and denies any side effects.  Principal Problem: Major depressive disorder, recurrent severe without psychotic features (HCC) Diagnosis:   Patient Active Problem List   Diagnosis Date Noted  . Cellulitis [L03.90] 02/11/2016  . Major depressive disorder, recurrent severe without psychotic features (HCC) [F33.2] 02/11/2016  . Alcohol use disorder, moderate, dependence (HCC) [F10.20] 02/11/2016  . Cocaine use disorder, moderate, dependence (HCC) [F14.20] 02/11/2016  . Tobacco use disorder [F17.200] 02/11/2016   Total Time spent with patient: 20 minutes  Past Psychiatric History: Depression, mood instability, substance use.  Past Medical History:  Past Medical History  Diagnosis Date  . Pilonidal cyst     Past Surgical History  Procedure Laterality Date  . Cesarean section     Family History: History reviewed. No  pertinent family history. Family Psychiatric  History: See H&P. Social History:  History  Alcohol Use  . Yes     History  Drug Use  . Yes  . Special: Cocaine    Social History   Social History  . Marital Status: Single    Spouse Name: N/A  . Number of Children: N/A  . Years of Education: N/A   Social History Main Topics  . Smoking status: Current Every Day Smoker -- 1.00 packs/day    Types: Cigarettes  . Smokeless tobacco: Current User  . Alcohol Use: Yes  . Drug Use: Yes    Special: Cocaine  . Sexual Activity: Not Asked   Other Topics Concern  . None   Social History Narrative   Additional Social History:    Pain Medications: see PTA meds Prescriptions: see PTA meds Over the Counter: see PTA meds History of alcohol / drug use?: Yes                    Sleep: Fair  Appetite:  Fair  Current Medications: Current Facility-Administered Medications  Medication Dose Route Frequency Provider Last Rate Last Dose  . acetaminophen (TYLENOL) tablet 1,000 mg  1,000 mg Oral Q6H PRN Jimmy FootmanAndrea Hernandez-Gonzalez, MD      . alum & mag hydroxide-simeth (MAALOX/MYLANTA) 200-200-20 MG/5ML suspension 30 mL  30 mL Oral Q4H PRN Audery AmelJohn T Clapacs, MD      . cyclobenzaprine (FLEXERIL) tablet 7.5 mg  7.5 mg Oral TID PRN Jimmy FootmanAndrea Hernandez-Gonzalez, MD      . gabapentin (NEURONTIN) capsule 300 mg  300 mg Oral TID Shari ProwsJolanta B Pucilowska, MD   300 mg  at 02/15/16 2133  . magnesium hydroxide (MILK OF MAGNESIA) suspension 30 mL  30 mL Oral Daily PRN Audery Amel, MD      . meloxicam (MOBIC) tablet 15 mg  15 mg Oral Daily Jolanta B Pucilowska, MD   15 mg at 02/15/16 0854  . nicotine (NICODERM CQ - dosed in mg/24 hours) patch 21 mg  21 mg Transdermal Daily Shari Prows, MD   21 mg at 02/15/16 0852  . Oxcarbazepine (TRILEPTAL) tablet 300 mg  300 mg Oral BID Shari Prows, MD   300 mg at 02/15/16 2134  . prazosin (MINIPRESS) capsule 2 mg  2 mg Oral BID Shari Prows, MD   2  mg at 02/15/16 2134  . sulfamethoxazole-trimethoprim (BACTRIM DS,SEPTRA DS) 800-160 MG per tablet 2 tablet  2 tablet Oral BID AC & HS Shari Prows, MD   2 tablet at 02/15/16 2134  . thiamine (VITAMIN B-1) tablet 100 mg  100 mg Oral Q breakfast Jolanta B Pucilowska, MD   100 mg at 02/15/16 0854  . traZODone (DESYREL) tablet 200 mg  200 mg Oral QHS PRN Shari Prows, MD   200 mg at 02/15/16 2134  . tuberculin injection 5 Units  5 Units Intradermal Once Shari Prows, MD   5 Units at 02/14/16 1746  . venlafaxine XR (EFFEXOR-XR) 24 hr capsule 150 mg  150 mg Oral Q breakfast Jolanta B Pucilowska, MD   150 mg at 02/15/16 0854    Lab Results:  No results found for this or any previous visit (from the past 48 hour(s)).  Blood Alcohol level:  Lab Results  Component Value Date   [facility] 15* 02/10/2016    Physical Findings: AIMS: Facial and Oral Movements Muscles of Facial Expression: None, normal Lips and Perioral Area: None, normal Jaw: None, normal Tongue: None, normal,Extremity Movements Upper (arms, wrists, hands, fingers): None, normal Lower (legs, knees, ankles, toes): None, normal, Trunk Movements Neck, shoulders, hips: None, normal, Overall Severity Severity of abnormal movements (highest score from questions above): None, normal Incapacitation due to abnormal movements: None, normal Patient's awareness of abnormal movements (rate only patient's report): No Awareness, Dental Status Current problems with teeth and/or dentures?: No (missing teeth) Does patient usually wear dentures?: No  CIWA:  CIWA-Ar Total: 5 COWS:  COWS Total Score: 0  Musculoskeletal: Strength & Muscle Tone: within normal limits Gait & Station: normal Patient leans: N/A  Psychiatric Specialty Exam: Physical Exam  Nursing note and vitals reviewed. Constitutional: She is oriented to person, place, and time. She appears well-developed and well-nourished.  HENT:  Head: Normocephalic.  Neck:  Normal range of motion.  Respiratory: Effort normal.  Musculoskeletal: Normal range of motion.  Neurological: She is alert and oriented to person, place, and time.    Review of Systems  Musculoskeletal: Positive for back pain.  Psychiatric/Behavioral: Positive for depression, suicidal ideas and substance abuse. The patient is nervous/anxious.   All other systems reviewed and are negative.   Blood pressure 109/53, pulse 64, temperature 97.6 F (36.4 C), temperature source Oral, resp. rate 18, height  (1.626 m), weight 81.647 kg (180 lb), last menstrual period 02/10/2016, SpO2 98 %.Body mass index is 30.88 kg/(m^2).  General Appearance: Casual  Eye Contact:  Good  Speech:  Clear and Coherent  Volume:  Normal  Mood:  Depressed, Hopeless and Worthless  Affect:  Appropriate  Thought Process:  Goal Directed  Orientation:  Full (Time, Place, and Person)  Thought Content:  WDL  Suicidal Thoughts:  No  Homicidal Thoughts:  No  Memory:  Immediate;   Fair Recent;   Fair Remote;   Fair  Judgement:  Poor  Insight:  Lacking  Psychomotor Activity:  Normal  Concentration:  Concentration: Fair and Attention Span: Fair  Recall:  [employer] of Knowledge:  Fair  Language:  Fair  Akathisia:  No  Handed:  Right  AIMS (if indicated):     Assets:  Communication Skills Desire for Improvement Physical Health Resilience Social Support  ADL's:  Intact  Cognition:  WNL  Sleep:  Number of Hours: 7.3     Treatment Plan Summary: Daily contact with patient to assess and evaluate symptoms and progress in treatment and Medication management   Ms. Manfredi is a 30 year old female with a history of bipolar depression and substance use admitted for worsening of depression and suicidal ideation in the context of relapse on substances and treatment noncompliance.  1. Suicidal ideation. The patient is able to contract for safety in the hospital.  2. Mood. We will start Trileptal for mood stabilization  and Effexor for depression.  3. Insomnia. We increase Trazodone to 200 mg.  4. Alcohol abuse. She completed Librium taper.  5. Substance abuse treatment. The patient desires treatment. There may be a bed available at [facility] on Monday. PPD placed.  6. Cellulitis. She is on Septra.  7. Smoking. Nicotine patch is available.  8. PTSD. We will start Minipress for nightmares and flashbacks and Neurontin for anxiety.   9. Back pain. We started Mobic.   10. Disposition. To be established.  Patient has been complaining of chronic back pain. I will order some Flexeril as needed knowledge changes will be made to her medication regimen. The patient feels she is improving and she is looking forward going to [facility] tomorrow.  Jimmy Footman, MD 02/16/2016, 9:56 AM

## 2016-02-16 NOTE — [facility] Group Notes (Signed)
[facility] LCSW Group Therapy  02/16/2016 2:21 PM  Type of Therapy:  Group Therapy  Participation Level:  Minimal  Participation Quality:  Attentive  Affect:  Appropriate  Cognitive:  Alert  Insight:  Limited  Engagement in Therapy:  Limited  Modes of Intervention:  Discussion, Education, Socialization and Support  Summary of Progress/Problems: Self esteem: Patients discussed self esteem and how it impacts them. They discussed what aspects in their lives has influenced their self esteem. They were challenged to identify changes that are needed in order to improve self esteem.  Pt attended group and stayed the entire time. Pt sat quietly and listened to other group member share.   Carly Drake L Dayyan Krist MSW, LCSWA  02/16/2016, 2:21 PM   

## 2016-02-16 NOTE — Progress Notes (Signed)
Patient was difficult to wake up this morning, she appeared sleepy. She did not attend her first group but attended p.m. groups. She interacted with peers once she was up. Patient denied SI/AVH and contracted for safety.

## 2016-02-16 NOTE — Progress Notes (Signed)
Client has been seen interacting with other clients; has had PM snack; drinking fluids; c/o having back pain at 8 P.M. And received Tylenol 650 mgs; took P.M. Medications; says that, she [facility]; denies having any suicidal or homicidal thoughts; denies having auditory or visual hallucinations or delusions; does not c/o of having any other pain sites. Client stated, "I think I'm going to have some more withdrawal."

## 2016-02-16 NOTE — Plan of Care (Signed)
Problem: Activity: Goal: Interest or engagement in leisure activities will improve Outcome: Not Progressing Patient stayed in her room during morning group. Encouraged to engage herself in an activity such as coloring instead of sleeping during the day.

## 2016-02-16 NOTE — Progress Notes (Signed)
Patient's PPD=540mm. Read at 1640.

## 2016-02-16 NOTE — [facility] Suicide Risk Assessment (Signed)
[facility] INPATIENT:  Family/Significant Other Suicide Prevention Education  Suicide Prevention Education:  Patient Refusal for Family/Significant Other Suicide Prevention Education: The patient Carly Drake has refused to provide written consent for family/significant other to be provided Family/Significant Other Suicide Prevention Education during admission and/or prior to discharge.  Physician notified.  Daisy Floroandace L Kaja Jackowski MSW, LCSWA  02/16/2016, 3:39 PM

## 2016-02-17 NOTE — Progress Notes (Signed)
Recreation Therapy Notes  INPATIENT RECREATION TR PLAN  Patient Details Name: Beaux [place]ne MRN: [id number] DOB: [birth year] Today's Date: 02/17/2016  Rec Therapy Plan Is patient appropriate for Therapeutic Recreation?: Yes Treatment times per week: At least once a week TR Treatment/Interventions: 1:1 session, Group participation (Comment) (Appropriate participation in daily recreational therapy tx)  Discharge Criteria Pt will be discharged from therapy if:: Treatment goals are met, Discharged Treatment plan/goals/alternatives discussed and agreed upon by:: Patient/family  Discharge Summary Short term goals set: See Care Plan Short term goals met: Complete Progress toward goals comments: One-to-one attended Which groups?: Leisure education, Coping skills One-to-one attended: Self-esteem, coping skills Reason goals not met: N/A Therapeutic equipment acquired: None Reason patient discharged from therapy: Discharge from hospital Pt/family agrees with progress & goals achieved: Yes Date patient discharged from therapy: 02/17/16   Leonette Monarch, LRT/CTRS 02/17/2016, 12:11 PM

## 2016-02-17 NOTE — Progress Notes (Signed)
D: Pt denies SI/HI/AVH. Pt is cooperative with treatment plan. Patient's affect is flat and sad, but brightens upon approach. Patient appears less anxious and she is interacting with peers and staff appropriately.  A: Pt was offered support and encouragement. Pt was given scheduled medications. Pt was encouraged to attend groups. Q 15 minute checks were done for safety.  R:Pt attends groups and interacts well with peers and staff. Pt is taking medication. Pt has no complaints.Pt receptive to treatment and safety maintained on unit.
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
## 2016-02-17 NOTE — Progress Notes (Signed)
  [facility] :  Will you be returning to the same living situation after discharge:  Yes,  [facility]  At discharge, do you have transportation home?: Yes,  husband  Do you have the ability to pay for your medications: Yes,  [facility]  Release of information consent forms completed and in the chart;  Patient's signature needed at discharge.  Patient to Follow up at: Follow-up Information    Follow up with [facility]  On 02/18/2016.   Why:  Your hospital follow up appointment will be walk in. Please walk in on Tuesday June 20th between 8:00am and 10:00am. Please take your ID and social security card.    Contact information:   [address],  [place], [place] [address] Phone: [phone] Fax: [phone]       Follow up with [facility] On 02/17/2016.   Why:  Please go directly to [facility] upon discharge.    Contact information:   [address],  [place], [place] [address] Phone: [phone]      Next level of care provider has access to [facility] Link:no  Safety Planning and Suicide Prevention discussed: Yes,  with patient   Have you used any form of tobacco in the last 30 days? (Cigarettes, Smokeless Tobacco, Cigars, and/or Pipes): Yes  Has patient been referred to the Quitline?: Patient refused referral  Patient has been referred for addiction treatment: Yes  Rondall Allegraandace L Caio Devera MSW, LCSWA  02/17/2016, 9:42 AM

## 2016-02-17 NOTE — Progress Notes (Deleted)
Recreation Therapy Notes          Jacquelynn CreeGreene,Galilee Pierron M 02/17/2016 12:11 PM

## 2016-02-17 NOTE — [facility] Suicide Risk Assessment (Signed)
[facility]   Principal Problem: Major depressive disorder, recurrent severe without psychotic features [facility](HCC) Discharge Diagnoses:  Patient Active Problem List   Diagnosis Date Noted  . Cellulitis [L03.90] 02/11/2016  . Major depressive disorder, recurrent severe without psychotic features (HCC) [F33.2] 02/11/2016  . Alcohol use disorder, moderate, dependence (HCC) [F10.20] 02/11/2016  . Cocaine use disorder, moderate, dependence (HCC) [F14.20] 02/11/2016  . Tobacco use disorder [F17.200] 02/11/2016    Total Time spent with patient: 30 minutes  Musculoskeletal: Strength & Muscle Tone: within normal limits Gait & Station: normal Patient leans: N/A  Psychiatric Specialty Exam: Review of Systems  Psychiatric/Behavioral: The patient is nervous/anxious.   All other systems reviewed and are negative.   Blood pressure 106/52, pulse 67, temperature 97.6 F (36.4 C), temperature source Oral, resp. rate 18, height 5\' 4"  (1.626 m), weight 81.647 kg (180 lb), last menstrual period 02/10/2016, SpO2 98 %.Body mass index is 30.88 kg/(m^2).  General Appearance: Casual  Eye Contact::  Good  Speech:  Clear and Coherent409  Volume:  Normal  Mood:  Euthymic  Affect:  Appropriate  Thought Process:  Goal Directed  Orientation:  Full (Time, Place, and Person)  Thought Content:  WDL  Suicidal Thoughts:  No  Homicidal Thoughts:  No  Memory:  Immediate;   Fair Recent;   Fair Remote;   Fair  Judgement:  Impaired  Insight:  Shallow  Psychomotor Activity:  Normal  Concentration:  Fair  Recall:  [employer]Fair  Fund of Knowledge:Fair  Language: Fair  Akathisia:  No  Handed:  Right  AIMS (if indicated):     Assets:  Communication Skills Desire for Improvement Physical Health Resilience Social Support  Sleep:  Number of Hours: 7.3  Cognition: WNL  ADL's:  Intact   Mental Status Per Nursing Assessment::   On Admission:  Suicide plan, Self-harm thoughts  Demographic  Factors:  Divorced or widowed, Low socioeconomic status and Unemployed  Loss Factors: Legal issues and Financial problems/change in socioeconomic status  Historical Factors: Prior suicide attempts, Family history of mental illness or substance abuse and Impulsivity  Risk Reduction Factors:   Responsible for children under 32 years of age and Positive social support  Continued Clinical Symptoms:  Bipolar Disorder:   Depressive phase Depression:   Comorbid alcohol abuse/dependence Impulsivity Alcohol/Substance Abuse/Dependencies  Cognitive Features That Contribute To Risk:  None    Suicide Risk:  Minimal: No identifiable suicidal ideation.  Patients presenting with no risk factors but with morbid ruminations; may be classified as minimal risk based on the severity of the depressive symptoms  Follow-up Information    Follow up with [facility]  On 02/18/2016.   Why:  Your hospital follow up appointment will be walk in. Please walk in on Tuesday June 20th between 8:00am and 10:00am. Please take your ID and social security card.    Contact information:   [address],  [place], [place] [address] Phone: [phone] Fax: [phone]       Plan Of Care/Follow-up recommendations:  Activity:  as tolerated. Diet:  regular. Other:  keep follow up appoinyments.  Kristine LineaJolanta Neno Hohensee, MD 02/17/2016, 4:23 AM

## 2016-02-17 NOTE — Progress Notes (Signed)
D/C instructions/transition record/suicide risk assessment/meds/follow-up appointments reviewed and given to patient, pt verbalized understanding, pt's belongings returned to pt, denies SI/HI/AVH. 

## 2016-03-05 ENCOUNTER — Encounter (HOSPITAL_COMMUNITY): Payer: Self-pay | Admitting: Emergency Medicine

## 2016-03-05 ENCOUNTER — Ambulatory Visit: Payer: Self-pay | Admitting: Physician Assistant

## 2016-03-05 ENCOUNTER — Encounter: Payer: Self-pay | Admitting: Physician Assistant

## 2016-03-05 ENCOUNTER — Emergency Department (HOSPITAL_COMMUNITY)
Admission: EM | Admit: 2016-03-05 | Discharge: 2016-03-05 | Disposition: A | Discharge status: Home / Self Care | Payer: Self-pay | Attending: Emergency Medicine | Admitting: Emergency Medicine

## 2016-03-05 VITALS — BP 100/56 | HR 49 | Temp 98.1°F | Ht 64.0 in | Wt 173.0 lb

## 2016-03-05 DIAGNOSIS — F17210 Nicotine dependence, cigarettes, uncomplicated: Secondary | ICD-10-CM | POA: Insufficient documentation

## 2016-03-05 DIAGNOSIS — B353 Tinea pedis: Secondary | ICD-10-CM

## 2016-03-05 DIAGNOSIS — Z131 Encounter for screening for diabetes mellitus: Secondary | ICD-10-CM

## 2016-03-05 DIAGNOSIS — F39 Unspecified mood [affective] disorder: Secondary | ICD-10-CM

## 2016-03-05 DIAGNOSIS — Z13220 Encounter for screening for lipoid disorders: Secondary | ICD-10-CM

## 2016-03-05 DIAGNOSIS — R001 Bradycardia, unspecified: Secondary | ICD-10-CM | POA: Insufficient documentation

## 2016-03-05 DIAGNOSIS — F1411 Cocaine abuse, in remission: Secondary | ICD-10-CM

## 2016-03-05 DIAGNOSIS — F1011 Alcohol abuse, in remission: Secondary | ICD-10-CM

## 2016-03-05 DIAGNOSIS — R5383 Other fatigue: Secondary | ICD-10-CM | POA: Insufficient documentation

## 2016-03-05 DIAGNOSIS — E876 Hypokalemia: Secondary | ICD-10-CM

## 2016-03-05 LAB — CBC WITH DIFFERENTIAL/PLATELET
BASOS PCT: 1 %
Basophils Absolute: 0.1 10*3/uL (ref 0.0–0.1)
Eosinophils Absolute: 0.2 10*3/uL (ref 0.0–0.7)
Eosinophils Relative: 3 %
HEMATOCRIT: 36 % (ref 36.0–46.0)
Hemoglobin: 12 g/dL (ref 12.0–15.0)
Lymphocytes Relative: 35 %
Lymphs Abs: 2.4 10*3/uL (ref 0.7–4.0)
MCH: 30.2 pg (ref 26.0–34.0)
MCHC: 33.3 g/dL (ref 30.0–36.0)
MCV: 90.5 fL (ref 78.0–100.0)
MONO ABS: 0.5 10*3/uL (ref 0.1–1.0)
MONOS PCT: 7 %
NEUTROS ABS: 3.6 10*3/uL (ref 1.7–7.7)
Neutrophils Relative %: 54 %
Platelets: 281 10*3/uL (ref 150–400)
RBC: 3.98 MIL/uL (ref 3.87–5.11)
RDW: 13.6 % (ref 11.5–15.5)
WBC: 6.7 10*3/uL (ref 4.0–10.5)

## 2016-03-05 LAB — BASIC METABOLIC PANEL
Anion gap: 7 (ref 5–15)
BUN: 18 mg/dL (ref 6–20)
CALCIUM: 8.6 mg/dL — AB (ref 8.9–10.3)
CO2: 27 mmol/L (ref 22–32)
CREATININE: 0.79 mg/dL (ref 0.44–1.00)
Chloride: 104 mmol/L (ref 101–111)
GFR calc non Af Amer: >60 mL/min (ref >60)
GLUCOSE: 87 mg/dL (ref 65–99)
Potassium: 4 mmol/L (ref 3.5–5.1)
Sodium: 138 mmol/L (ref 135–145)

## 2016-03-05 LAB — HEPATIC FUNCTION PANEL
ALK PHOS: 41 U/L (ref 38–126)
ALT: 28 U/L (ref 14–54)
AST: 31 U/L (ref 15–41)
Albumin: 3.4 g/dL — ABNORMAL LOW (ref 3.5–5.0)
BILIRUBIN DIRECT: 0.1 mg/dL (ref 0.1–0.5)
BILIRUBIN INDIRECT: 0.2 mg/dL — AB (ref 0.3–0.9)
BILIRUBIN TOTAL: 0.3 mg/dL (ref 0.3–1.2)
Total Protein: 7.4 g/dL (ref 6.5–8.1)

## 2016-03-05 LAB — URINALYSIS, ROUTINE W REFLEX MICROSCOPIC
BILIRUBIN URINE: NEGATIVE
GLUCOSE, UA: NEGATIVE mg/dL
HGB URINE DIPSTICK: NEGATIVE
KETONES UR: NEGATIVE mg/dL
NITRITE: NEGATIVE
PH: 7 (ref 5.0–8.0)
Protein, ur: NEGATIVE mg/dL
Specific Gravity, Urine: 1.02 (ref 1.005–1.030)

## 2016-03-05 LAB — PREGNANCY, URINE: Preg Test, Ur: NEGATIVE

## 2016-03-05 LAB — LIPID PANEL
Cholesterol: 118 mg/dL — ABNORMAL LOW (ref 125–200)
HDL: 35 mg/dL — AB (ref >=46)
LDL CALC: 68 mg/dL (ref <130)
TRIGLYCERIDES: 77 mg/dL (ref <150)
Total CHOL/HDL Ratio: 3.4 Ratio (ref <=5.0)
VLDL: 15 mg/dL (ref <30)

## 2016-03-05 LAB — POTASSIUM: Potassium: 4.2 mmol/L (ref 3.5–5.3)

## 2016-03-05 LAB — URINE MICROSCOPIC-ADD ON: RBC / HPF: NONE SEEN RBC/hpf (ref 0–5)

## 2016-03-05 LAB — GLUCOSE, POCT (MANUAL RESULT ENTRY): POC Glucose: 84 mg/dl (ref 70–99)

## 2016-03-05 MED ORDER — SODIUM CHLORIDE 0.9 % IV BOLUS (SEPSIS)
1000.0000 mL | Freq: Once | INTRAVENOUS | Status: AC
  Administered 2016-03-05: 1000 mL via INTRAVENOUS

## 2016-03-05 MED ORDER — NYSTATIN 100000 UNIT/GM EX CREA: 1.0000 "application " | TOPICAL_CREAM | Freq: Two times a day (BID) | CUTANEOUS | Status: DC

## 2016-03-05 NOTE — ED Notes (Signed)
Pt reports she had low BP and pulse today and has been feeling weak. States she has been "clean for 24 days".

## 2016-03-05 NOTE — Patient Instructions (Signed)

## 2016-03-05 NOTE — ED Notes (Signed)
Pt given a sprite to drink. 

## 2016-03-05 NOTE — ED Notes (Signed)
MD at bedside. 

## 2016-03-05 NOTE — ED Notes (Signed)
Pt c/o feeling weak all over, dizziness at times and nausea.

## 2016-03-05 NOTE — ED Provider Notes (Signed)
CSN: [id number]     Arrival date & time 03/05/16  2024 History  By signing my name below, I, Linna DarnerRussell Turner, attest that this documentation has been prepared under the direction and in the presence of physician practitioner, Glynn OctaveStephen Takisha Pelle, MD. Electronically Signed: Linna Darnerussell Turner, Scribe. 03/05/2016. 9:26 PM.   Chief Complaint  Patient presents with  . Fatigue    The history is provided by the patient. No language interpreter was used.     HPI Comments: Ronald LoboCrystal Berrian is a 30 y.o. female with PMHx of heart murmur, bipolar disorder, paranoid schizophrenia, depression, and anxiety who presents to the Emergency Department complaining of sudden onset, constant, fatigue and weakness beginning today. Pt notes improving mild lightheadedness and nausea as well. Pt states that she went to a free health clinic today and her pulse was measured at 49 upon arrival and upon leaving. She states that her blood pressure was low at the clinic as well. Pt reports that she does not know what her pulse is normally. She reports that she is currently in drug and alcohol rehab; she is 24 days clean from alcohol and crack-cocaine use. She denies IV drug use or prescription pill abuse. She reports that she has been eating and drinking normally lately. Pt states there is no chance she is pregnant. She recently finished antibiotics for left foot cellulitis. She notes that she uses her mental health medications regularly. She denies vomiting, diarrhea, dizziness, abdominal pain, or any other associated symptoms.  Past Medical History  Diagnosis Date  . Pilonidal cyst   . Heart murmur since birth  . Bipolar disorder (HCC)   . Paranoid schizophrenia (HCC)   . Depression   . Anxiety    Past Surgical History  Procedure Laterality Date  . Cesarean section  2007    1x   History reviewed. No pertinent family history. Social History  Substance Use Topics  . Smoking status: Current Every Day Smoker -- 1.00 packs/day for 19  years    Types: Cigarettes  . Smokeless tobacco: Never Used  . Alcohol Use: No     Comment: alcoholic. Sober since 02-10-16. Started drinking at age 30   OB History    No data available     Review of Systems  A complete 10 system review of systems was obtained and all systems are negative except as noted in the HPI and PMH.   Allergies  Clindamycin/lincomycin and Other  Home Medications   Prior to Admission medications   Medication Sig Start Date End Date Taking? Authorizing Provider  gabapentin (NEURONTIN) 300 MG capsule Take 1 capsule (300 mg total) by mouth 3 (three) times daily. 02/14/16   Shari ProwsJolanta B Pucilowska, MD  meloxicam (MOBIC) 15 MG tablet Take 1 tablet (15 mg total) by mouth daily. 02/14/16   Shari ProwsJolanta B Pucilowska, MD  nystatin cream (MYCOSTATIN) Apply 1 application topically 2 (two) times daily. 03/05/16   Jacquelin HawkingShannon McElroy, PA-C  Oxcarbazepine (TRILEPTAL) 300 MG tablet Take 1 tablet (300 mg total) by mouth 2 (two) times daily. 02/14/16   Shari ProwsJolanta B Pucilowska, MD  prazosin (MINIPRESS) 2 MG capsule Take 1 capsule (2 mg total) by mouth 2 (two) times daily. 02/14/16   Shari ProwsJolanta B Pucilowska, MD  traZODone (DESYREL) 100 MG tablet Take 2 tablets (200 mg total) by mouth at bedtime as needed for sleep. Patient taking differently: Take 200 mg by mouth at bedtime.  02/14/16   Shari ProwsJolanta B Pucilowska, MD  venlafaxine XR (EFFEXOR-XR) 150 MG 24 hr capsule  Take 1 capsule (150 mg total) by mouth daily with breakfast. Patient taking differently: Take 150 mg by mouth 2 (two) times daily.  02/14/16   Jolanta B Pucilowska, MD   BP 112/48 mmHg  Pulse 51  Temp(Src) 98.9 F (37.2 C)  Resp 20  Ht 5\' 4"  (1.626 m)  Wt 173 lb (78.472 kg)  BMI 29.68 kg/m2  SpO2 99%  LMP 02/10/2016 Physical Exam  Constitutional: She is oriented to person, place, and time. She appears well-developed and well-nourished. No distress.  HENT:  Head: Normocephalic and atraumatic.  Mouth/Throat: Oropharynx is clear and moist.  No oropharyngeal exudate.  Poor dentition  Eyes: Conjunctivae and EOM are normal. Pupils are equal, round, and reactive to light.  Neck: Normal range of motion. Neck supple.  No meningismus.  Cardiovascular: Regular rhythm and intact distal pulses.  Bradycardia present.   Murmur heard. Regular bradycardia Faint systolic murmur  Pulmonary/Chest: Effort normal and breath sounds normal. No respiratory distress.  Abdominal: Soft. There is no tenderness. There is no rebound and no guarding.  Musculoskeletal: Normal range of motion. She exhibits no edema or tenderness.  Neurological: She is alert and oriented to person, place, and time. No cranial nerve deficit. She exhibits normal muscle tone. Coordination normal.  No ataxia on finger to nose bilaterally. No pronator drift. 5/5 strength throughout. CN 2-12 intact.Equal grip strength. Sensation intact.   Skin: Skin is warm.  Psychiatric: She has a normal mood and affect. Her behavior is normal.  Nursing note and vitals reviewed.   ED Course  Procedures (including critical care time)  DIAGNOSTIC STUDIES: Oxygen Saturation is 99% on RA, normal by my interpretation.    COORDINATION OF CARE: 9:26 PM Discussed treatment plan with pt at bedside and pt agreed to plan.  Labs Review Labs Reviewed  BASIC METABOLIC PANEL - Abnormal; Notable for the following:    Calcium 8.6 (*)    All other components within normal limits  URINALYSIS, ROUTINE W REFLEX MICROSCOPIC (NOT AT [facility]) - Abnormal; Notable for the following:    APPearance HAZY (*)    Leukocytes, UA TRACE (*)    All other components within normal limits  HEPATIC FUNCTION PANEL - Abnormal; Notable for the following:    Albumin 3.4 (*)    Indirect Bilirubin 0.2 (*)    All other components within normal limits  URINE MICROSCOPIC-ADD ON - Abnormal; Notable for the following:    Squamous Epithelial / LPF 6-30 (*)    Bacteria, UA FEW (*)    All other components within normal limits  CBC  WITH DIFFERENTIAL/PLATELET  PREGNANCY, URINE    Imaging Review No results found. I have personally reviewed and evaluated these images and lab results as part of my medical decision-making.   EKG Interpretation   Date/Time:  Thursday March 05 2016 22:32:24 EDT Ventricular Rate:  60 PR Interval:    QRS Duration: 76 QT Interval:  398 QTC Calculation: 398 R Axis:   79 Text Interpretation:  Sinus rhythm No significant change was found  Confirmed by Manus GunningANCOUR  MD, Eleazar Kimmey [phone]([id number]) on 03/05/2016 10:54:56 PM      MDM   Final diagnoses:  Other fatigue  Sinus bradycardia  Patient states she had low blood pressure and low pulse today while she was being seen at the free clinic. She is clean from alcohol and crack for the 24 days. Denies chest pain or shortness of breath. Good by mouth intake.  EKG shows sinus bradycardia. Orthostatics are negative. She  is given IV fluids and by mouth fluids. Labs reassuring. UA contaminated.  Tolerating PO and ambulatory. Asymptomatic from bradycardia.  Was 70-80s during visit last month. No new meds. No AV blockers.  No evidence of AV block.  Feels improved with fluids.  Followup with PCP and cardiology prn. Return precautions discussed.  I personally performed the services described in this documentation, which was scribed in my presence. The recorded information has been reviewed and is accurate.   Glynn Octave, MD 03/06/16 [id number]

## 2016-03-05 NOTE — Progress Notes (Signed)
BP 100/56 mmHg  Pulse 49  Temp(Src) 98.1 F (36.7 C)  Ht 5\' 4"  (1.626 m)  Wt 173 lb (78.472 kg)  BMI 29.68 kg/m2  LMP 02/10/2016   Subjective:    Patient ID: Carly Drake, female    DOB: 15-Jul-1986, 30 y.o.   MRN: [id number]  HPI: Carly Drake is a 30 y.o. female presenting on 03/05/2016 for New Patient (Initial Visit); Leg Swelling; and Foot Swelling   HPI  Chief Complaint  Patient presents with  . New Patient (Initial Visit)    [facility]. pt states she has never had a PCP.  Marland Kitchen. Leg Swelling    on L leg. and Bilateral Knee swelling  . Foot Swelling    bilateral    [facility].  Unable to check o2 sat today due to nails but sat was 98% in ER on 02/10/16.  Pt states long time since last PAP.   Relevant past medical, surgical, family and social history reviewed and updated as indicated. Interim medical history since our last visit reviewed. Allergies and medications reviewed and updated.   Current outpatient prescriptions:  .  gabapentin (NEURONTIN) 300 MG capsule, Take 1 capsule (300 mg total) by mouth 3 (three) times daily., Disp: 90 capsule, Rfl: 3 .  meloxicam (MOBIC) 15 MG tablet, Take 1 tablet (15 mg total) by mouth daily., Disp: 30 tablet, Rfl: 0 .  Oxcarbazepine (TRILEPTAL) 300 MG tablet, Take 1 tablet (300 mg total) by mouth 2 (two) times daily., Disp: 60 tablet, Rfl: 3 .  prazosin (MINIPRESS) 2 MG capsule, Take 1 capsule (2 mg total) by mouth 2 (two) times daily., Disp: 60 capsule, Rfl: 3 .  traZODone (DESYREL) 100 MG tablet, Take 2 tablets (200 mg total) by mouth at bedtime as needed for sleep. (Patient taking differently: Take 200 mg by mouth at bedtime. ), Disp: 30 tablet, Rfl: 0 .  venlafaxine XR (EFFEXOR-XR) 150 MG 24 hr capsule, Take 1 capsule (150 mg total) by mouth daily with breakfast. (Patient taking differently: Take 150 mg by mouth 2 (two) times daily. ), Disp: 30 capsule, Rfl: 3  Review of Systems  Constitutional:  Positive for appetite change and unexpected weight change. Negative for fever, chills, diaphoresis and fatigue.  HENT: Positive for dental problem. Negative for congestion, drooling, ear pain, facial swelling, hearing loss, mouth sores, sneezing, sore throat, trouble swallowing and voice change.   Eyes: Positive for discharge and itching. Negative for pain, redness and visual disturbance.  Respiratory: Positive for shortness of breath. Negative for cough, choking and wheezing.   Cardiovascular: Negative for chest pain, palpitations and leg swelling.  Gastrointestinal: Negative for vomiting, abdominal pain, diarrhea, constipation and blood in stool.  Endocrine: Negative for cold intolerance, heat intolerance and polydipsia.  Genitourinary: Negative for dysuria, hematuria and decreased urine volume.  Musculoskeletal: Positive for back pain, arthralgias and gait problem.  Skin: Negative for rash.  Allergic/Immunologic: Negative for environmental allergies.  Neurological: Negative for seizures, syncope, light-headedness and headaches.  Hematological: Negative for adenopathy.  Psychiatric/Behavioral: Positive for dysphoric mood and agitation. Negative for suicidal ideas. The patient is nervous/anxious.     Per HPI unless specifically indicated above     Objective:    BP 100/56 mmHg  Pulse 49  Temp(Src) 98.1 F (36.7 C)  Ht 5\' 4"  (1.626 m)  Wt 173 lb (78.472 kg)  BMI 29.68 kg/m2  LMP 02/10/2016  Wt Readings from Last 3 Encounters:  03/05/16 173 lb (  78.472 kg)  02/11/16 180 lb (81.647 kg)  02/10/16 180 lb (81.647 kg)    Physical Exam  Constitutional: She is oriented to person, place, and time. She appears well-developed and well-nourished.  HENT:  Head: Normocephalic and atraumatic.  Mouth/Throat: Uvula is midline and oropharynx is clear and moist. No oropharyngeal exudate.  Dentition poor  Eyes: Conjunctivae and EOM are normal. Pupils unequal: R slightly larger than L.  Neck: Neck  supple. No thyromegaly present.  Cardiovascular: Normal rate and regular rhythm.   Pulmonary/Chest: Effort normal and breath sounds normal.  Abdominal: Soft. Bowel sounds are normal. She exhibits no mass. There is no hepatosplenomegaly. There is no tenderness.  Musculoskeletal: She exhibits no edema.  Lymphadenopathy:    She has no cervical adenopathy.  Neurological: She is alert and oriented to person, place, and time. Gait normal.  Skin: Skin is warm and dry. Rash noted.  Bilateral feet with significant peeling on entire plantar surface extending up to sides.  Mild maceration some webspaces. No secondary infection  Psychiatric: She has a normal mood and affect. Her behavior is normal.  Vitals reviewed.   Results for orders placed or performed in visit on 03/05/16  POCT Glucose (CBG)  Result Value Ref Range   POC Glucose 84 70 - 99 mg/dl      Assessment & Plan:    Encounter Diagnoses  Name Primary?  . Tinea pedis of both feet Yes  . Cigarette nicotine dependence without complication   . Hypokalemia   . Alcohol abuse, in remission   . Cocaine abuse in remission   . Screening for diabetes mellitus (DM)   . Screening cholesterol level   . Mood disorder (HCC)      -Check fasting labs tomorrow. Will call with results -counseled on foot care and gave Rx for nystatin cream -f/u 1 month for PAP and recheck feet.  RTO sooner prn

## 2016-03-05 NOTE — Discharge Instructions (Signed)
Bradycardia Keep yourself hydrated. Followup with your doctor. Return to the ED if you develop chest pain, shortness of breath, dizziness or any other concerns.  Bradycardia is a slower-than-normal heart rate. A normal resting heart rate for an adult ranges from 60 to 100 beats per minute. With bradycardia, the resting heart rate is less than 60 beats per minute. Bradycardia is a problem if your heart cannot pump enough oxygen-rich blood through your body. Bradycardia is not a problem for everyone. For some healthy adults, a slow resting heart rate is normal.  CAUSES  Bradycardia may be caused by:  A problem with the heart's electrical system, such as heart block.  A problem with the heart's natural pacemaker (sinus node).  Heart disease, damage, or infection.  Certain medicines that treat heart conditions.  Certain conditions, such as hypothyroidism and obstructive sleep apnea. RISK FACTORS  Risk factors include:  Being 3265 or older.  Having high blood pressure (hypertension), high cholesterol (hyperlipidemia), or diabetes.  Drinking heavily, using tobacco products, or using drugs.  Being stressed. SIGNS AND SYMPTOMS  Signs and symptoms include:  Light-headedness.  Faintingor near fainting.  Fatigue and weakness.  Shortness of breath.  Chest pain (angina).  Drowsiness.  Confusion.  Dizziness. DIAGNOSIS  Diagnosis of bradycardia may include:  A physical exam.  An electrocardiogram (ECG).  Blood tests. TREATMENT  Treatment for bradycardia may include:  Treatment of an underlying condition.  Pacemaker placement. A pacemaker is a small, battery-powered device that is placed under the skin and is programmed to sense your heartbeats. If your heart rate is lower than the programmed rate, the pacemaker will pace your heart.  Changing your medicines or dosages. HOME CARE INSTRUCTIONS  Take medicines only as directed by your health care provider.  Manage any  health conditions that contribute to bradycardia as directed by your health care provider.  Follow a heart-healthy diet. A dietitian can help educate you on healthy food options and changes.  Follow an exercise program approved by your health care provider.  Maintain a healthy weight. Lose weight as approved by your health care provider.  Do not use tobacco products, including cigarettes, chewing tobacco, or electronic cigarettes. If you need help quitting, ask your health care provider.  Do not use illegal drugs.  Limit alcohol intake to no more than 1 drink per day for nonpregnant women and 2 drinks per day for men. One drink equals 12 ounces of beer, 5 ounces of wine, or 1 ounces of hard liquor.  Keep all follow-up visits as directed by your health care provider. This is important. SEEK MEDICAL CARE IF:  You feel light-headed or dizzy.  You almost faint.  You feel weak or are easily fatigued during physical activity.  You experience confusion or have memory problems. SEEK IMMEDIATE MEDICAL CARE IF:   You faint.  You have an irregular heartbeat.  You have chest pain.  You have trouble breathing. MAKE SURE YOU:   Understand these instructions.  Will watch your condition.  Will get help right away if you are not doing well or get worse.   This information is not intended to replace advice given to you by your health care provider. Make sure you discuss any questions you have with your health care provider.   Document Released: 05/09/2002 Document Revised: 09/07/2014 Document Reviewed: 11/22/2013 Elsevier Interactive Patient Education [employer]2016 Elsevier Inc.

## 2016-03-05 NOTE — ED Notes (Signed)
Ambulated pt to the nearest bathroom and back, she reported dizziness.

## 2016-03-10 ENCOUNTER — Ambulatory Visit: Payer: Self-pay | Admitting: Physician Assistant

## 2016-03-10 ENCOUNTER — Encounter: Payer: Self-pay | Admitting: Physician Assistant

## 2016-03-10 VITALS — BP 100/50 | HR 75 | Temp 98.2°F | Ht 64.0 in | Wt 169.8 lb

## 2016-03-10 DIAGNOSIS — F419 Anxiety disorder, unspecified: Secondary | ICD-10-CM

## 2016-03-10 NOTE — Progress Notes (Signed)
BP 100/50 mmHg  Pulse 75  Temp(Src) 98.2 F (36.8 C)  Ht 5\' 4"  (1.626 m)  Wt 169 lb 12.8 oz (77.021 kg)  BMI 29.13 kg/m2  SpO2 99%  LMP 02/10/2016   Subjective:    Patient ID: Carly Drake, female    DOB: 30-Dec-1985, 30 y.o.   MRN: [id number]  HPI: Carly LoboCrystal Zabawa is a 30 y.o. female presenting on 03/10/2016 for Follow-up   HPI  Chief Complaint  Patient presents with  . Follow-up    from [facility]. pt states she is feeling weak with pulse running in the 40s and BP around 88/35 one time. pt states she checks pulse and BP at home with her roomate's monitor.     Pt says she is feeling fine now.  Says mostly she was scared b/c she went to the ER and they told her she had to follow up with cardiologist in 2 days and so that has created a lot of stress and anxiety for her.  Pt goes to [facility] for MH.   Relevant past medical, surgical, family and social history reviewed and updated as indicated. Interim medical history since our last visit reviewed. Allergies and medications reviewed and updated.   Current outpatient prescriptions:  .  gabapentin (NEURONTIN) 300 MG capsule, Take 1 capsule (300 mg total) by mouth 3 (three) times daily., Disp: 90 capsule, Rfl: 3 .  meloxicam (MOBIC) 15 MG tablet, Take 1 tablet (15 mg total) by mouth daily., Disp: 30 tablet, Rfl: 0 .  Oxcarbazepine (TRILEPTAL) 300 MG tablet, Take 1 tablet (300 mg total) by mouth 2 (two) times daily., Disp: 60 tablet, Rfl: 3 .  prazosin (MINIPRESS) 2 MG capsule, Take 1 capsule (2 mg total) by mouth 2 (two) times daily., Disp: 60 capsule, Rfl: 3 .  traZODone (DESYREL) 100 MG tablet, Take 2 tablets (200 mg total) by mouth at bedtime as needed for sleep. (Patient taking differently: Take 200 mg by mouth at bedtime. ), Disp: 30 tablet, Rfl: 0 .  venlafaxine XR (EFFEXOR-XR) 150 MG 24 hr capsule, Take 1 capsule (150 mg total) by mouth daily with breakfast. (Patient taking differently: Take 150 mg by mouth 2 (two) times daily. ),  Disp: 30 capsule, Rfl: 3 .  nystatin cream (MYCOSTATIN), Apply 1 application topically 2 (two) times daily. (Patient not taking: Reported on 03/10/2016), Disp: 30 g, Rfl: 0   Review of Systems  Constitutional: Negative for fever, chills, diaphoresis, appetite change, fatigue and unexpected weight change.  HENT: Negative for congestion, dental problem, ear pain, facial swelling, hearing loss, mouth sores, sneezing, sore throat, trouble swallowing and voice change.   Eyes: Positive for itching. Negative for pain, discharge and redness.  Respiratory: Negative for cough, choking, shortness of breath and wheezing.   Cardiovascular: Negative for chest pain, palpitations and leg swelling.  Gastrointestinal: Negative for vomiting, abdominal pain, diarrhea, constipation and blood in stool.  Endocrine: Positive for cold intolerance. Negative for heat intolerance and polydipsia.  Genitourinary: Negative for dysuria, hematuria and decreased urine volume.  Musculoskeletal: Positive for back pain. Negative for arthralgias and gait problem.  Skin: Negative for rash.  Allergic/Immunologic: Negative for environmental allergies.  Neurological: Negative for seizures, syncope, light-headedness and headaches.  Hematological: Negative for adenopathy.  Psychiatric/Behavioral: Positive for dysphoric mood. Negative for suicidal ideas and agitation. The patient is nervous/anxious.     Per HPI unless specifically indicated above     Objective:    BP 100/50 mmHg  Pulse 75  Temp(Src) 98.2 F (  36.8 C)  Ht  (1.626 m)  Wt 169 lb 12.8 oz (77.021 kg)  BMI 29.13 kg/m2  SpO2 99%  LMP 02/10/2016  Wt Readings from Last 3 Encounters:  03/10/16 169 lb 12.8 oz (77.021 kg)  03/05/16 173 lb (78.472 kg)  03/05/16 173 lb (78.472 kg)    Physical Exam  Constitutional: She is oriented to person, place, and time. She appears well-developed and well-nourished.  HENT:  Head: Normocephalic and atraumatic.  Dentition  poor  Neck: Neck supple.  Cardiovascular: Normal rate and regular rhythm.  Exam reveals no gallop and no friction rub.   No murmur heard. Pulmonary/Chest: Effort normal and breath sounds normal. No respiratory distress. She has no wheezes.  Abdominal: Soft. Bowel sounds are normal. She exhibits no mass. There is no hepatosplenomegaly. There is no tenderness.  Musculoskeletal: She exhibits no edema.  Lymphadenopathy:    She has no cervical adenopathy.  Neurological: She is alert and oriented to person, place, and time.  Skin: Skin is warm and dry.  Psychiatric: She has a normal mood and affect. Her behavior is normal.  Vitals reviewed.       Assessment & Plan:   Encounter Diagnosis  Name Primary?  Marland Kitchen Anxiety Yes    -reassured pt extensive labs and vitals are normal -recommended that she stop using her roommate's monitor which is only causing her significant anxiety -Gave contact information for assistance with dental care -f/u September as scheduled.  RTO sooner if she starts feeling bad again

## 2016-03-22 ENCOUNTER — Encounter (HOSPITAL_COMMUNITY): Payer: Self-pay | Admitting: Emergency Medicine

## 2016-03-22 ENCOUNTER — Emergency Department (HOSPITAL_COMMUNITY)
Admission: EM | Admit: 2016-03-22 | Discharge: 2016-03-23 | Disposition: A | Discharge status: Other Acute Inpatient Hospital | Payer: Self-pay | Attending: Emergency Medicine | Admitting: Emergency Medicine

## 2016-03-22 DIAGNOSIS — R443 Hallucinations, unspecified: Secondary | ICD-10-CM | POA: Insufficient documentation

## 2016-03-22 DIAGNOSIS — Z79899 Other long term (current) drug therapy: Secondary | ICD-10-CM | POA: Insufficient documentation

## 2016-03-22 DIAGNOSIS — F319 Bipolar disorder, unspecified: Secondary | ICD-10-CM | POA: Insufficient documentation

## 2016-03-22 DIAGNOSIS — F17210 Nicotine dependence, cigarettes, uncomplicated: Secondary | ICD-10-CM | POA: Insufficient documentation

## 2016-03-22 LAB — RAPID URINE DRUG SCREEN, HOSP PERFORMED
Amphetamines: NOT DETECTED
BARBITURATES: NOT DETECTED
BENZODIAZEPINES: NOT DETECTED
COCAINE: NOT DETECTED
Opiates: NOT DETECTED
Tetrahydrocannabinol: NOT DETECTED

## 2016-03-22 LAB — COMPREHENSIVE METABOLIC PANEL
ALBUMIN: 3.8 g/dL (ref 3.5–5.0)
ALK PHOS: 48 U/L (ref 38–126)
ALT: 17 U/L (ref 14–54)
AST: 20 U/L (ref 15–41)
Anion gap: 3 — ABNORMAL LOW (ref 5–15)
BILIRUBIN TOTAL: 0.5 mg/dL (ref 0.3–1.2)
BUN: 16 mg/dL (ref 6–20)
CALCIUM: 8.5 mg/dL — AB (ref 8.9–10.3)
CO2: 28 mmol/L (ref 22–32)
CREATININE: 0.76 mg/dL (ref 0.44–1.00)
Chloride: 103 mmol/L (ref 101–111)
GFR calc Af Amer: >60 mL/min (ref >60)
GLUCOSE: 88 mg/dL (ref 65–99)
Potassium: 4.2 mmol/L (ref 3.5–5.1)
Sodium: 134 mmol/L — ABNORMAL LOW (ref 135–145)
TOTAL PROTEIN: 7.8 g/dL (ref 6.5–8.1)

## 2016-03-22 LAB — CBC WITH DIFFERENTIAL/PLATELET
BASOS ABS: 0 10*3/uL (ref 0.0–0.1)
BASOS PCT: 1 %
EOS PCT: 2 %
Eosinophils Absolute: 0.1 10*3/uL (ref 0.0–0.7)
HEMATOCRIT: 40.5 % (ref 36.0–46.0)
Hemoglobin: 13.5 g/dL (ref 12.0–15.0)
Lymphocytes Relative: 35 %
Lymphs Abs: 1.9 10*3/uL (ref 0.7–4.0)
MCH: 30.1 pg (ref 26.0–34.0)
MCHC: 33.3 g/dL (ref 30.0–36.0)
MCV: 90.2 fL (ref 78.0–100.0)
MONO ABS: 0.4 10*3/uL (ref 0.1–1.0)
MONOS PCT: 8 %
NEUTROS ABS: 2.9 10*3/uL (ref 1.7–7.7)
Neutrophils Relative %: 54 %
PLATELETS: 193 10*3/uL (ref 150–400)
RBC: 4.49 MIL/uL (ref 3.87–5.11)
RDW: 13.1 % (ref 11.5–15.5)
WBC: 5.4 10*3/uL (ref 4.0–10.5)

## 2016-03-22 LAB — ETHANOL

## 2016-03-22 MED ORDER — ACETAMINOPHEN 325 MG PO TABS
650.0000 mg | ORAL_TABLET | ORAL | Status: DC | PRN
  Administered 2016-03-22: 650 mg via ORAL
  Filled 2016-03-22: qty 2

## 2016-03-22 MED ORDER — PRAZOSIN HCL 1 MG PO CAPS
2.0000 mg | ORAL_CAPSULE | Freq: Two times a day (BID) | ORAL | Status: DC
  Administered 2016-03-22 – 2016-03-23 (×2): 2 mg via ORAL
  Filled 2016-03-22 (×4): qty 2
  Filled 2016-03-22 (×2): qty 1
  Filled 2016-03-22: qty 2

## 2016-03-22 MED ORDER — OXCARBAZEPINE 300 MG PO TABS
300.0000 mg | ORAL_TABLET | Freq: Two times a day (BID) | ORAL | Status: DC
  Administered 2016-03-22 – 2016-03-23 (×2): 300 mg via ORAL
  Filled 2016-03-22 (×7): qty 1

## 2016-03-22 MED ORDER — ALUM & MAG HYDROXIDE-SIMETH 200-200-20 MG/5ML PO SUSP: 30.0000 mL | ORAL | Status: DC | PRN

## 2016-03-22 MED ORDER — IBUPROFEN 400 MG PO TABS: 600.0000 mg | ORAL_TABLET | Freq: Three times a day (TID) | ORAL | Status: DC | PRN

## 2016-03-22 MED ORDER — ONDANSETRON HCL 4 MG PO TABS: 4.0000 mg | ORAL_TABLET | Freq: Three times a day (TID) | ORAL | Status: DC | PRN

## 2016-03-22 MED ORDER — TRAZODONE HCL 50 MG PO TABS
200.0000 mg | ORAL_TABLET | Freq: Every day | ORAL | Status: DC
  Administered 2016-03-22: 200 mg via ORAL
  Filled 2016-03-22: qty 4

## 2016-03-22 MED ORDER — ZOLPIDEM TARTRATE 5 MG PO TABS: 5.0000 mg | ORAL_TABLET | Freq: Every evening | ORAL | Status: DC | PRN

## 2016-03-22 MED ORDER — GABAPENTIN 300 MG PO CAPS
300.0000 mg | ORAL_CAPSULE | Freq: Three times a day (TID) | ORAL | Status: DC
  Administered 2016-03-22 – 2016-03-23 (×4): 300 mg via ORAL
  Filled 2016-03-22 (×4): qty 1

## 2016-03-22 MED ORDER — NICOTINE 21 MG/24HR TD PT24
21.0000 mg | MEDICATED_PATCH | Freq: Every day | TRANSDERMAL | Status: DC | PRN
  Administered 2016-03-22 – 2016-03-23 (×2): 21 mg via TRANSDERMAL
  Filled 2016-03-22 (×2): qty 1

## 2016-03-22 MED ORDER — VENLAFAXINE HCL ER 37.5 MG PO CP24
150.0000 mg | ORAL_CAPSULE | Freq: Every day | ORAL | Status: DC
  Administered 2016-03-23: 150 mg via ORAL
  Filled 2016-03-22: qty 4

## 2016-03-22 NOTE — ED Provider Notes (Addendum)
[facility]-EMERGENCY DEPT Provider Note   CSN: [id number] Arrival date & time: 03/22/16  1118  First Provider Contact:  None       History   Chief Complaint Chief Complaint  Patient presents with  . V70.1    HPI Callen Zuba is a 30 y.o. female.  She came here today for evaluation of worsening audio hallucinations. She states that the hallucinations state that "someone is going to hurt me." This makes her feel, like she "will hurt someone." She has been clean for about one month. She has been in rehabilitation, for 1 month. She last saw her psychiatrist, 2 weeks ago. She tried to get another appointment with a psychiatrist, but he was unable to see her until October. She states that this is part of the reason she came here today. She denies fever, chills, nausea, vomiting, cough, shortness of breath or chest pain. There are no other known modifying factors.  HPI  Past Medical History:  Diagnosis Date  . Anxiety   . Bipolar disorder (HCC)   . Depression   . Heart murmur since birth  . Paranoid schizophrenia (HCC)   . Pilonidal cyst     Patient Active Problem List   Diagnosis Date Noted  . MDD (major depressive disorder), recurrent, severe, with psychosis (HCC) 03/23/2016  . Cellulitis 02/11/2016  . Major depressive disorder, recurrent severe without psychotic features (HCC) 02/11/2016  . Alcohol use disorder, moderate, dependence (HCC) 02/11/2016  . Cocaine use disorder, moderate, dependence (HCC) 02/11/2016  . Tobacco use disorder 02/11/2016    Past Surgical History:  Procedure Laterality Date  . CESAREAN SECTION  2007   1x    OB History    No data available       Home Medications    Prior to Admission medications   Medication Sig Start Date End Date Taking? Authorizing Provider  acetaminophen (TYLENOL) 500 MG tablet Take 1,000 mg by mouth every 6 (six) hours as needed for mild pain.   Yes Historical Provider, MD  gabapentin (NEURONTIN) 300 MG capsule Take 1  capsule (300 mg total) by mouth 3 (three) times daily. 02/14/16  Yes Jolanta B Pucilowska, MD  meloxicam (MOBIC) 15 MG tablet Take 1 tablet (15 mg total) by mouth daily. 02/14/16  Yes Jolanta B Pucilowska, MD  Oxcarbazepine (TRILEPTAL) 300 MG tablet Take 1 tablet (300 mg total) by mouth 2 (two) times daily. 02/14/16  Yes Jolanta B Pucilowska, MD  prazosin (MINIPRESS) 2 MG capsule Take 1 capsule (2 mg total) by mouth 2 (two) times daily. 02/14/16  Yes Jolanta B Pucilowska, MD  traZODone (DESYREL) 100 MG tablet Take 2 tablets (200 mg total) by mouth at bedtime as needed for sleep. Patient taking differently: Take 200 mg by mouth at bedtime.  02/14/16  Yes Shari Prows, MD  venlafaxine XR (EFFEXOR-XR) 150 MG 24 hr capsule Take 1 capsule (150 mg total) by mouth daily with breakfast. 02/14/16  Yes Shari Prows, MD    Family History No family history on file.  Social History Social History  Substance Use Topics  . Smoking status: Current Every Day Smoker    Packs/day: 1.00    Years: 19.00    Types: Cigarettes  . Smokeless tobacco: Never Used  . Alcohol use No     Comment: alcoholic. Sober since 02-09-16. Started drinking at age 48     Allergies   Clindamycin/lincomycin and Other   Review of Systems Review of Systems  All other systems reviewed and  are negative.    Physical Exam Updated Vital Signs BP 117/68 (BP Location: Left Arm)   Pulse 71   Temp 98 F (36.7 C) (Oral)   Resp 16   Ht 5\' 4"  (1.626 m)   Wt 170 lb (77.1 kg)   LMP 03/15/2016   SpO2 99%   BMI 29.18 kg/m   Physical Exam  Constitutional: She appears well-developed and well-nourished. No distress.  HENT:  Head: Normocephalic and atraumatic.  Eyes: Conjunctivae are normal.  Neck: Neck supple.  Cardiovascular: Normal rate.   No murmur heard. Pulmonary/Chest: Effort normal. No respiratory distress.  Abdominal: Soft.  Musculoskeletal: She exhibits no edema.  Neurological: She is alert.  Skin: Skin  is warm and dry.  Psychiatric: She has a normal mood and affect.  Nursing note and vitals reviewed.    ED Treatments / Results  Labs (all labs ordered are listed, but only abnormal results are displayed) Labs Reviewed  COMPREHENSIVE METABOLIC PANEL - Abnormal; Notable for the following:       Result Value   Sodium 134 (*)    Calcium 8.5 (*)    Anion gap 3 (*)    All other components within normal limits  ETHANOL  CBC WITH DIFFERENTIAL/PLATELET  URINE RAPID DRUG SCREEN, HOSP PERFORMED    EKG  EKG Interpretation None       Radiology No results found.  Procedures Procedures (including critical care time)  Medications Ordered in ED Medications - No data to display   Initial Impression / Assessment and Plan / ED Course  I have reviewed the triage vital signs and the nursing notes.  Pertinent labs & imaging results that were available during my care of the patient were reviewed by me and considered in my medical decision making (see chart for details).  Clinical Course    Medications - No data to display  Patient Vitals for the past 24 hrs:  BP Temp Temp src Pulse Resp SpO2  03/23/16 1724 117/68 98 F (36.7 C) Oral 71 16 99 %    4:03 PM Reevaluation with update and discussion. After initial assessment and treatment, an updated evaluation reveals No change in clinical status. At this time. She is medically cleared for treatment by psychiatry.Mancel Bale L   Consulted TTS   Final Clinical Impressions(s) / ED Diagnoses   Final diagnoses:  Hallucinations   Audio hallucinations, with secondary thoughts of harming other people. She is in rehabilitation from only substance abuse. She will require evaluation by psychiatry, prior to disposition.  Nursing Notes Reviewed/ Care Coordinated, and agree without changes. Applicable Imaging Reviewed.  Interpretation of Laboratory Data incorporated into ED treatment  Plan- as per TTS in conjunction with oncoming  provider team   New Prescriptions Discharge Medication List as of 03/23/2016  8:45 PM       Mancel Bale, MD 03/22/16 1606    Mancel Bale, MD 03/24/16 [phone]

## 2016-03-22 NOTE — ED Notes (Signed)
Made several calls to [facility] to advise that pt is wanting to leave.  Due to loosing place at [facility].

## 2016-03-22 NOTE — ED Notes (Signed)
Patient was given bedtime snack with evening medications.
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
## 2016-03-22 NOTE — Progress Notes (Signed)
Disposition CSW completed patient referrals to the following inpatient psych facilities:  [facility]  CSW will continue to follow patient for placement needs.  [facility] Disposition CSW [phone]

## 2016-03-22 NOTE — ED Notes (Signed)
Pt asking about what the telepsych recommended.  Advised pt that [facility] recommended inpatient placement.  Pt upset .  States she is a resident at [facility] house and that they will not hold her place if she is admitted.  States she will have to go back out on the streets if she is not a [employer].

## 2016-03-22 NOTE — ED Notes (Signed)
Pt concerned about going inpt because she will lose her spot at [facility] house.  Notified Dr. Effie Shy and he feels that pt should be admitted and spoke with Mardella Layman, [facility] at [facility] and she said she will let tts know that housing will be an issue for pt after discharge.  Was instructed to notify them if pt has to be IVC'd.

## 2016-03-22 NOTE — ED Triage Notes (Signed)
Patient c/o visual and auditory hallucinations. Patient states started approx 20 days. Patient denies any SI or HI but is afraid it will progress to that point. Patient states that the voices  Are telling her that people are out to get her. Patient states she has had difficulties with hallucinations since a young age but started illegal drugs to cope. Patient states sober x30 days ago. Per patient does take medication and has been taking them as described but believes medications need to be adjusted. Patient states recently at [facility] regional behavior center and has appointment with [facility] on August 5.

## 2016-03-22 NOTE — Progress Notes (Signed)
7/23-Per Lillia Abed, [facility] pt informed medical staff she is not able to comply with inpt recommendation b/o residing at [facility] and it she does not return she will lose her bed there.       Maryelizabeth Rowan, MSW, [facility] Triage Specialist [phone]

## 2016-03-22 NOTE — ED Notes (Signed)
Pt states that last time she wore paper scrubs,  That she broke out in a rash.  Giving pt gown to wear.

## 2016-03-22 NOTE — ED Notes (Signed)
[facility] ready for telepsych, moved pt to private room with sitter for interview.

## 2016-03-23 ENCOUNTER — Encounter (HOSPITAL_COMMUNITY): Payer: Self-pay | Admitting: *Deleted

## 2016-03-23 ENCOUNTER — Inpatient Hospital Stay (HOSPITAL_COMMUNITY)
Admission: AD | Admit: 2016-03-23 | Discharge: 2016-03-27 | DRG: 885 | Disposition: A | Discharge status: Home / Self Care | Payer: No Typology Code available for payment source | Source: Intra-hospital | Attending: Psychiatry | Admitting: Psychiatry

## 2016-03-23 DIAGNOSIS — F17210 Nicotine dependence, cigarettes, uncomplicated: Secondary | ICD-10-CM | POA: Diagnosis present

## 2016-03-23 DIAGNOSIS — F1020 Alcohol dependence, uncomplicated: Secondary | ICD-10-CM | POA: Diagnosis not present

## 2016-03-23 DIAGNOSIS — F1420 Cocaine dependence, uncomplicated: Secondary | ICD-10-CM | POA: Diagnosis not present

## 2016-03-23 DIAGNOSIS — F4310 Post-traumatic stress disorder, unspecified: Secondary | ICD-10-CM | POA: Diagnosis present

## 2016-03-23 DIAGNOSIS — F333 Major depressive disorder, recurrent, severe with psychotic symptoms: Secondary | ICD-10-CM | POA: Diagnosis present

## 2016-03-23 DIAGNOSIS — Z915 Personal history of self-harm: Secondary | ICD-10-CM

## 2016-03-23 DIAGNOSIS — R441 Visual hallucinations: Secondary | ICD-10-CM | POA: Diagnosis present

## 2016-03-23 DIAGNOSIS — R45851 Suicidal ideations: Secondary | ICD-10-CM | POA: Diagnosis present

## 2016-03-23 DIAGNOSIS — Z590 Homelessness: Secondary | ICD-10-CM

## 2016-03-23 MED ORDER — OXCARBAZEPINE 300 MG PO TABS
300.0000 mg | ORAL_TABLET | Freq: Two times a day (BID) | ORAL | Status: DC
  Administered 2016-03-23 – 2016-03-27 (×8): 300 mg via ORAL
  Filled 2016-03-23: qty 1
  Filled 2016-03-23: qty 28
  Filled 2016-03-23: qty 1
  Filled 2016-03-23: qty 28
  Filled 2016-03-23 (×9): qty 1

## 2016-03-23 MED ORDER — MAGNESIUM HYDROXIDE 400 MG/5ML PO SUSP
30.0000 mL | Freq: Every day | ORAL | Status: DC | PRN
  Administered 2016-03-26: 30 mL via ORAL
  Filled 2016-03-23: qty 30

## 2016-03-23 MED ORDER — RISPERIDONE 0.5 MG PO TABS
0.5000 mg | ORAL_TABLET | Freq: Two times a day (BID) | ORAL | Status: DC
  Administered 2016-03-23: 0.5 mg via ORAL
  Filled 2016-03-23 (×5): qty 1

## 2016-03-23 MED ORDER — ALUM & MAG HYDROXIDE-SIMETH 200-200-20 MG/5ML PO SUSP
30.0000 mL | ORAL | Status: DC | PRN
  Administered 2016-03-26: 30 mL via ORAL
  Filled 2016-03-23: qty 30

## 2016-03-23 MED ORDER — PRAZOSIN HCL 2 MG PO CAPS
2.0000 mg | ORAL_CAPSULE | Freq: Two times a day (BID) | ORAL | Status: DC
  Administered 2016-03-24 – 2016-03-27 (×5): 2 mg via ORAL
  Filled 2016-03-23 (×2): qty 28
  Filled 2016-03-23 (×9): qty 1
  Filled 2016-03-23: qty 2
  Filled 2016-03-23: qty 1

## 2016-03-23 MED ORDER — GABAPENTIN 300 MG PO CAPS
300.0000 mg | ORAL_CAPSULE | Freq: Three times a day (TID) | ORAL | Status: DC
  Administered 2016-03-24 – 2016-03-26 (×8): 300 mg via ORAL
  Filled 2016-03-23 (×13): qty 1

## 2016-03-23 MED ORDER — TRAZODONE HCL 100 MG PO TABS
200.0000 mg | ORAL_TABLET | Freq: Every day | ORAL | Status: DC
  Administered 2016-03-23 – 2016-03-26 (×4): 200 mg via ORAL
  Filled 2016-03-23 (×4): qty 2
  Filled 2016-03-23: qty 14
  Filled 2016-03-23 (×2): qty 2

## 2016-03-23 MED ORDER — ONDANSETRON HCL 4 MG PO TABS: 4.0000 mg | ORAL_TABLET | Freq: Three times a day (TID) | ORAL | Status: DC | PRN

## 2016-03-23 MED ORDER — VENLAFAXINE HCL ER 150 MG PO CP24
150.0000 mg | ORAL_CAPSULE | Freq: Every day | ORAL | Status: DC
  Administered 2016-03-24 – 2016-03-27 (×4): 150 mg via ORAL
  Filled 2016-03-23: qty 14
  Filled 2016-03-23 (×5): qty 1

## 2016-03-23 MED ORDER — IBUPROFEN 600 MG PO TABS
600.0000 mg | ORAL_TABLET | Freq: Three times a day (TID) | ORAL | Status: DC | PRN
  Administered 2016-03-24 – 2016-03-25 (×2): 600 mg via ORAL
  Filled 2016-03-23 (×2): qty 1

## 2016-03-23 MED ORDER — RISPERIDONE 0.5 MG PO TABS
0.5000 mg | ORAL_TABLET | Freq: Two times a day (BID) | ORAL | Status: DC
  Administered 2016-03-23 – 2016-03-27 (×8): 0.5 mg via ORAL
  Filled 2016-03-23 (×2): qty 1
  Filled 2016-03-23: qty 14
  Filled 2016-03-23 (×4): qty 1
  Filled 2016-03-23: qty 14
  Filled 2016-03-23 (×5): qty 1

## 2016-03-23 NOTE — ED Notes (Signed)
TTS in progress 

## 2016-03-23 NOTE — ED Notes (Signed)
Shower done

## 2016-03-23 NOTE — ED Provider Notes (Signed)
Patient accepted behavioral Hospital by Dr. Elna Breslow. She appears stable for transfer.  BP 117/68 (BP Location: Left Arm)   Pulse 71   Temp 98 F (36.7 C) (Oral)   Resp 16   Ht 5\' 4"  (1.626 m)   Wt 170 lb (77.1 kg)   LMP 03/15/2016   SpO2 99%   BMI 29.18 kg/m      Carly Octave, MD 03/23/16 [phone]
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
## 2016-03-23 NOTE — Consult Note (Signed)
Telepsych Consultation   Reason for Consult: Psychosis  Referring Physician: EDP Patient Identification: Carly Drake MRN:  [id number] Principal Diagnosis: <principal problem not specified> Diagnosis:   Patient Active Problem List   Diagnosis Date Noted  . Cellulitis [L03.90] 02/11/2016  . Major depressive disorder, recurrent severe without psychotic features ([place]) [F33.2] 02/11/2016  . Alcohol use disorder, moderate, dependence ([place]) [F10.20] 02/11/2016  . Cocaine use disorder, moderate, dependence ([place]) [F14.20] 02/11/2016  . Tobacco use disorder [F17.200] 02/11/2016    Total Time spent with patient: 30 minutes  Subjective:   Carly Drake is a 30 y.o. female patient admitted with complaint of auditory/visual hallucination.   HPI:   Carly Drake is a 30 y.o. female with a history of depression, anxiety, hallucinations, and substance use who presented voluntarily to [facility] with complaint of auditory/visual hallucination.  Patient is currently living in a group home (half-way) house and is participating in an IOP substance use program at [facility]. She was inpatient at [facility] from 02/12/2016 to 02/17/2016. For the past few days, Patient has experienced auditory hallucinations -- voices telling her that "people are out to get me" -- that she had responded to verbally. Patient reports that she is having to isolate in her room due to not "being able to be around large crowds."  She also reported seeing shadows moving on the wall. Patient is worried about losing her place at the halfway house but does not feel stable to return at this time. Patient was calm and cooperative during the assessment. There was no obvious evidence that patient is responding to internal stimuli but patient did appear very anxious about her symptoms. Upon review of her current medication list it is noted that patient is not currently receiving an antipsychotic medication. She reported attempting to have her next appointment at  [facility] moved up but was told this was not possible per her report. Patient denies any suicidal or homicidal ideation.   Past Psychiatric History: Bipolar Disorder   Risk to Self: Suicidal Ideation: No-Not Currently/Within Last 6 Months Suicidal Intent: No Is patient at risk for suicide?: No Suicidal Plan?: No Specify Current Suicidal Plan: Overdose Access to Means: No What has been your use of drugs/alcohol within the last 12 months?: Previous daily use of crack cocaine and alcohol How many times?: 3 Other Self Harm Risks: Denied Triggers for Past Attempts: Unknown Intentional Self Injurious Behavior: None Risk to Others: Homicidal Ideation: No Thoughts of Harm to Others: No Current Homicidal Intent: No Current Homicidal Plan: No Access to Homicidal Means: No Identified Victim: NA History of harm to others?: No Assessment of Violence: None Noted Violent Behavior Description: NA Does patient have access to weapons?: No Criminal Charges Pending?: Yes Describe Pending Criminal Charges: Obtaining by false pretenses; drug para possession Does patient have a court date: Yes Court Date: 05/14/16 Prior Inpatient Therapy: Prior Inpatient Therapy: Yes Prior Therapy Dates: 2017, 2014 Prior Therapy Facilty/Provider(s): [facility], [facility] Reason for Treatment: SI, hallucination, detox Prior Outpatient Therapy: Prior Outpatient Therapy: Yes Prior Therapy Dates: Ongoing Prior Therapy Facilty/Provider(s): [facility] Reason for Treatment: Detox/Sobriety Does patient have an ACCT team?: No Does patient have Intensive In-House Services?  : No Does patient have [facility] services? : No Does patient have P4CC services?: No  Past Medical History:  Past Medical History:  Diagnosis Date  . Anxiety   . Bipolar disorder ([place])   . Depression   . Heart murmur since birth  . Paranoid schizophrenia ([place])   . Pilonidal cyst  Past Surgical History:  Procedure Laterality Date  .  CESAREAN SECTION  2007   1x   Family History: No family history on file. Social History:  History  Alcohol Use No    Comment: alcoholic. Sober since 02-09-16. Started drinking at age 1     History  Drug Use No    Comment: clean since 02-09-16. Started daily use at age 18    Social History   Social History  . Marital status: Single    Spouse name: N/A  . Number of children: N/A  . Years of education: N/A   Social History Main Topics  . Smoking status: Current Every Day Smoker    Packs/day: 1.00    Years: 19.00    Types: Cigarettes  . Smokeless tobacco: Never Used  . Alcohol use No     Comment: alcoholic. Sober since 02-09-16. Started drinking at age 28  . Drug use: No     Comment: clean since 02-09-16. Started daily use at age 39  . Sexual activity: Not Currently    Birth control/ protection: None   Other Topics Concern  . None   Social History Narrative  . None   Additional Social History:    Allergies:   Allergies  Allergen Reactions  . Clindamycin/Lincomycin Nausea And Vomiting  . Other Nausea And Vomiting    NO ONIONS OR PEPPERS  Unknown antibiotic    Labs:  Results for orders placed or performed during the hospital encounter of 03/22/16 (from the past 48 hour(s))  Comprehensive metabolic panel     Status: Abnormal   Collection Time: 03/22/16 11:51 AM  Result Value Ref Range   Sodium 134 (L) 135 - 145 mmol/L   Potassium 4.2 3.5 - 5.1 mmol/L   Chloride 103 101 - 111 mmol/L   CO2 28 22 - 32 mmol/L   Glucose, Bld 88 65 - 99 mg/dL   BUN 16 6 - 20 mg/dL   Creatinine, Ser 0.76 0.44 - 1.00 mg/dL   Calcium 8.5 (L) 8.9 - 10.3 mg/dL   Total Protein 7.8 6.5 - 8.1 g/dL   Albumin 3.8 3.5 - 5.0 g/dL   AST 20 15 - 41 U/L   ALT 17 14 - 54 U/L   Alkaline Phosphatase 48 38 - 126 U/L   Total Bilirubin 0.5 0.3 - 1.2 mg/dL   GFR calc non Af Amer >60 >60 mL/min   GFR calc Af Amer >60 >60 mL/min    Comment: (NOTE) The eGFR has been calculated using the CKD EPI  equation. This calculation has not been validated in all clinical situations. eGFR's persistently <60 mL/min signify possible Chronic Kidney Disease.    Anion gap 3 (L) 5 - 15  Ethanol     Status: None   Collection Time: 03/22/16 11:51 AM  Result Value Ref Range   Alcohol, Ethyl (B) <5 <5 mg/dL    Comment:        LOWEST DETECTABLE LIMIT FOR SERUM ALCOHOL IS 5 mg/dL FOR MEDICAL PURPOSES ONLY   CBC with Diff     Status: None   Collection Time: 03/22/16 11:51 AM  Result Value Ref Range   WBC 5.4 4.0 - 10.5 K/uL   RBC 4.49 3.87 - 5.11 MIL/uL   Hemoglobin 13.5 12.0 - 15.0 g/dL   HCT 40.5 36.0 - 46.0 %   MCV 90.2 78.0 - 100.0 fL   MCH 30.1 26.0 - 34.0 pg   MCHC 33.3 30.0 - 36.0 g/dL  RDW 13.1 11.5 - 15.5 %   Platelets 193 150 - 400 K/uL   Neutrophils Relative % 54 %   Neutro Abs 2.9 1.7 - 7.7 K/uL   Lymphocytes Relative 35 %   Lymphs Abs 1.9 0.7 - 4.0 K/uL   Monocytes Relative 8 %   Monocytes Absolute 0.4 0.1 - 1.0 K/uL   Eosinophils Relative 2 %   Eosinophils Absolute 0.1 0.0 - 0.7 K/uL   Basophils Relative 1 %   Basophils Absolute 0.0 0.0 - 0.1 K/uL  Urine rapid drug screen (hosp performed)not at [facility]     Status: None   Collection Time: 03/22/16 11:56 AM  Result Value Ref Range   Opiates NONE DETECTED NONE DETECTED   Cocaine NONE DETECTED NONE DETECTED   Benzodiazepines NONE DETECTED NONE DETECTED   Amphetamines NONE DETECTED NONE DETECTED   Tetrahydrocannabinol NONE DETECTED NONE DETECTED   Barbiturates NONE DETECTED NONE DETECTED    Comment:        DRUG SCREEN FOR MEDICAL PURPOSES ONLY.  IF CONFIRMATION IS NEEDED FOR ANY PURPOSE, NOTIFY LAB WITHIN 5 DAYS.        LOWEST DETECTABLE LIMITS FOR URINE DRUG SCREEN Drug Class       Cutoff (ng/mL) Amphetamine      1000 Barbiturate      200 Benzodiazepine   111 Tricyclics       735 Opiates          300 Cocaine          300 THC              50     Current Facility-Administered Medications  Medication Dose Route  Frequency Provider Last Rate Last Dose  . acetaminophen (TYLENOL) tablet 650 mg  650 mg Oral Q4H PRN Daleen Bo, MD   650 mg at 03/22/16 2001  . alum & mag hydroxide-simeth (MAALOX/MYLANTA) 200-200-20 MG/5ML suspension 30 mL  30 mL Oral PRN Daleen Bo, MD      . gabapentin (NEURONTIN) capsule 300 mg  300 mg Oral TID Daleen Bo, MD   300 mg at 03/23/16 0839  . ibuprofen (ADVIL,MOTRIN) tablet 600 mg  600 mg Oral Q8H PRN Daleen Bo, MD      . nicotine (NICODERM CQ - dosed in mg/24 hours) patch 21 mg  21 mg Transdermal Daily PRN Daleen Bo, MD   21 mg at 03/22/16 1542  . ondansetron (ZOFRAN) tablet 4 mg  4 mg Oral Q8H PRN Daleen Bo, MD      . Oxcarbazepine (TRILEPTAL) tablet 300 mg  300 mg Oral BID Daleen Bo, MD   300 mg at 03/23/16 0944  . prazosin (MINIPRESS) capsule 2 mg  2 mg Oral BID Daleen Bo, MD   2 mg at 03/23/16 0944  . traZODone (DESYREL) tablet 200 mg  200 mg Oral QHS Daleen Bo, MD   200 mg at 03/22/16 2123  . venlafaxine XR (EFFEXOR-XR) 24 hr capsule 150 mg  150 mg Oral Q breakfast Daleen Bo, MD   150 mg at 03/23/16 0839  . zolpidem (AMBIEN) tablet 5 mg  5 mg Oral QHS PRN Daleen Bo, MD       Current Outpatient Prescriptions  Medication Sig Dispense Refill  . acetaminophen (TYLENOL) 500 MG tablet Take 1,000 mg by mouth every 6 (six) hours as needed for mild pain.    Marland Kitchen gabapentin (NEURONTIN) 300 MG capsule Take 1 capsule (300 mg total) by mouth 3 (three) times daily. 90 capsule 3  .  meloxicam (MOBIC) 15 MG tablet Take 1 tablet (15 mg total) by mouth daily. 30 tablet 0  . Oxcarbazepine (TRILEPTAL) 300 MG tablet Take 1 tablet (300 mg total) by mouth 2 (two) times daily. 60 tablet 3  . prazosin (MINIPRESS) 2 MG capsule Take 1 capsule (2 mg total) by mouth 2 (two) times daily. 60 capsule 3  . traZODone (DESYREL) 100 MG tablet Take 2 tablets (200 mg total) by mouth at bedtime as needed for sleep. (Patient taking differently: Take 200 mg by mouth at bedtime.  ) 30 tablet 0  . venlafaxine XR (EFFEXOR-XR) 150 MG 24 hr capsule Take 1 capsule (150 mg total) by mouth daily with breakfast. 30 capsule 3    Musculoskeletal:  Unable to assess  Psychiatric Specialty Exam: Physical Exam  ROS  Blood pressure 107/99, pulse 69, temperature 98.2 F (36.8 C), resp. rate 16, height '5\' 4"'  (1.626 m), weight 77.1 kg (170 lb), last menstrual period 03/15/2016, SpO2 98 %.Body mass index is 29.18 kg/m.  General Appearance: Casual  Eye Contact:  Good  Speech:  Clear and Coherent  Volume:  Normal  Mood:  Anxious  Affect:  Congruent  Thought Process:  Coherent and Goal Directed  Orientation:  Full (Time, Place, and Person)  Thought Content:  Hallucinations: Auditory Visual  Suicidal Thoughts:  No  Homicidal Thoughts:  No  Memory:  Immediate;   Good Recent;   Good Remote;   Good  Judgement:  Fair  Insight:  Present  Psychomotor Activity:  Normal  Concentration:  Concentration: Good and Attention Span: Good  Recall:  Good  Fund of Knowledge:  Good  Language:  Good  Akathisia:  No  Handed:  Right  AIMS (if indicated):     Assets:  Communication Skills Desire for Improvement Housing Leisure Time Physical Health Resilience  ADL's:  Intact  Cognition:  WNL  Sleep:        Treatment Plan Summary: Patient needs inpatient admission for management of psychotic symptoms. Would recommend adding Risperdal 0.5 mg bid for psychotic symptoms.   Disposition: Recommend psychiatric Inpatient admission when medically cleared. Supportive therapy provided about ongoing stressors.  Elmarie Shiley, NP 03/23/2016 10:16 AM

## 2016-03-23 NOTE — Tx Team (Signed)
Initial Interdisciplinary Treatment Plan   PATIENT STRESSORS: "Hearing voices and seeing things"   PATIENT STRENGTHS: Communication skills General fund of knowledge Motivation for treatment/growth Physical Health Supportive family/friends   PROBLEM LIST: Problem List/Patient Goals Date to be addressed Date deferred Reason deferred Estimated date of resolution  Psychosis 03/23/2016  03/23/2016   D/C  Depression 03/23/2016  03/23/2016   D/C  Anxiety 03/23/2016  03/23/2016   D/C  "Voices in my head" 03/23/2016  03/23/2016   D/C  "The seeing things" 03/23/2016  03/23/2016   D/C                           DISCHARGE CRITERIA:  Ability to meet basic life and health needs Adequate post-discharge living arrangements Improved stabilization in mood, thinking, and/or behavior Motivation to continue treatment in a less acute level of care Need for constant or close observation no longer present Verbal commitment to aftercare and medication compliance  PRELIMINARY DISCHARGE PLAN: Attend 12-step recovery group Outpatient therapy Return to previous living arrangement  PATIENT/FAMIILY INVOLVEMENT: This treatment plan has been presented to and reviewed with the patient, Kayelee Mceachin.  The patient and family have been given the opportunity to ask questions and make suggestions.  Larry Sierras P 03/23/2016, 10:38 PM

## 2016-03-23 NOTE — ED Notes (Signed)
Dietary 4848 informed that pt is allergic to peppers and onions.

## 2016-03-23 NOTE — Progress Notes (Addendum)
Patient has been accepted at [facility] to Dr. Elna Breslow, to room 500 bed 2, bed available at 20:00 tonight. Call report at [phone]. [facility] RN has been informed.   Melbourne Abts, LCSWA Disposition staff 03/23/2016 5:16 PM

## 2016-03-24 ENCOUNTER — Encounter (HOSPITAL_COMMUNITY): Payer: Self-pay | Admitting: Emergency Medicine

## 2016-03-24 ENCOUNTER — Encounter (HOSPITAL_COMMUNITY): Payer: Self-pay | Admitting: Psychiatry

## 2016-03-24 DIAGNOSIS — F1020 Alcohol dependence, uncomplicated: Secondary | ICD-10-CM

## 2016-03-24 DIAGNOSIS — F1420 Cocaine dependence, uncomplicated: Secondary | ICD-10-CM

## 2016-03-24 DIAGNOSIS — F4310 Post-traumatic stress disorder, unspecified: Secondary | ICD-10-CM

## 2016-03-24 MED ORDER — NICOTINE 21 MG/24HR TD PT24
21.0000 mg | MEDICATED_PATCH | Freq: Every day | TRANSDERMAL | Status: DC
  Administered 2016-03-24 – 2016-03-27 (×4): 21 mg via TRANSDERMAL
  Filled 2016-03-24 (×8): qty 1

## 2016-03-24 NOTE — [facility] Group Notes (Signed)
[facility] Group Notes:  (Nursing/MHT/Case Management/Adjunct)  Date:  03/24/2016  Time:  0900  Type of Therapy:  Nurse Education  Participation Level:  Active  Participation Quality:  Appropriate  Affect:  Appropriate  Cognitive:  Appropriate  Insight:  Appropriate  Engagement in Group:  Engaged  Modes of Intervention:  Discussion, Education and Support  Summary of Progress/Problems: Abri was appropriate during group and shared that she was in recovery.  Maurine Simmering 03/24/2016, 9:40 AM

## 2016-03-24 NOTE — Progress Notes (Signed)
D: Carly Drake has been calm and appropriate in the milieu today. She denied SI/HI but indicated she this a.m that she was experiencing some AVH -- seeing shadows and hearing whispers to "be careful." No responding to internal stimuli witnessed. On her self inventory, she reported good sleep, good appetite, normal energy level, and good concentration. She rated her depression, anxiety, and hopelessness all zero. She wrote that her goal was "getting my mind right."  A: Meds given as ordered. Q15 safety checks maintained. Support/encouragement offered. R: Pt remains free from harm and continues with treatment. Will continue to monitor for needs/safety.

## 2016-03-24 NOTE — H&P (Signed)
Psychiatric Admission Assessment Adult  Patient Identification: Carly Drake MRN:  [id number]  Date of Evaluation:  03/24/2016  Chief Complaint: Auditory/visual hallucinations  Principal Diagnosis:  Paranoid Schizoaffective disorder, Bipolar affective disorder, Diagnosis:   Patient Active Problem List   Diagnosis Date Noted  . MDD (major depressive disorder), recurrent, severe, with psychosis (HCC) [F33.3] 03/23/2016  . Cellulitis [L03.90] 02/11/2016  . Major depressive disorder, recurrent severe without psychotic features (HCC) [F33.2] 02/11/2016  . Alcohol use disorder, moderate, dependence (HCC) [F10.20] 02/11/2016  . Cocaine use disorder, moderate, dependence (HCC) [F14.20] 02/11/2016  . Tobacco use disorder [F17.200] 02/11/2016   History of Present Illness: Carly Drake is a 30 year old Caucasian female with hx of polysubstance dependence & mental illness. She is currently residing at the [facility] in [place], [place]. This is considered a residential substance abuse treatment program for individuals with substance abuse issues. She is admitted to the [facility] adult unit from the [facility] with complaints of auditory/visual hallucinations. During this admission assessment, [name] reports, "A staff from the [facility] took me to the [facility] 2 days ago. I was hearing voices & seeing things as well. The hallucinations started 2 weeks ago & it has been worsening. The voices started when I was young, then, my drug/ETOH use worsened it from the age of 30 & 58. I have been cleaned from alcohol & crack x 40 days. I guess that was why the voices came back. Usually, when I drink & use, the hallucinations were not as bad. When I got to the [facility] 2 days ago, I was started on medication that made me feel better again. I don't know why I came to this hospital. I should be discharged. I do not want to lose my bed at the [facility]. I have been living there x 2 months. I  feel good now. I'm better, no more voices or seeing things. I'm not suicidal or homicidal".  Objective: Reports indicated that Carly Drake has hx of multiple suicide attempts by cutting.  Past psychiatric history. She was in mental health while in prison and diagnosed with bipolar illness. This diagnosis was confirmed during her hospitalization at [facility] in 2014. She attempted suicide 3 times by cutting her wrists twice in prison.  Family psychiatric history. Her mother suffers depression and anxiety. Her sister uses drugs and attempted suicide.  Social history. She has a 86 year old son who lives with his father. She is currently married but separated from her husband and homeless.  Total Time spent with patient: 1 hour  Is the patient at risk to self? No.  Has the patient been a risk to self in the past 6 months? No.  Has the patient been a risk to self within the distant past? No.  Is the patient a risk to others? No.  Has the patient been a risk to others in the past 6 months? No.  Has the patient been a risk to others within the distant past? No.   Prior Inpatient Therapy: [facility], 2017, [facility], 2014  Prior Outpatient Therapy: [facility], [facility] clinic in [place], [place].    Alcohol Screening: 1. How often do you have a drink containing alcohol?: Never 2. How many drinks containing alcohol do you have on a typical day when you are drinking?: 1 or 2 3. How often do you have six or more drinks on one occasion?: Never Preliminary Score: 0 4. How often during the last year have you  found that you were not able to stop drinking once you had started?: Never 5. How often during the last year have you failed to do what was normally expected from you becasue of drinking?: Never 6. How often during the last year have you needed a first drink in the morning to get yourself going after a heavy drinking session?: Never 7. How often during the last year have you had a feeling of guilt of  remorse after drinking?: Never 8. How often during the last year have you been unable to remember what happened the night before because you had been drinking?: Never 9. Have you or someone else been injured as a result of your drinking?: No 10. Has a relative or friend or a doctor or another health worker been concerned about your drinking or suggested you cut down?: No Alcohol Use Disorder Identification Test Final Score (AUDIT): 0 Brief Intervention: AUDIT score less than 7 or less-screening does not suggest unhealthy drinking-brief intervention not indicated  Substance Abuse History in the last 12 months:  Yes.   Consequences of Substance Abuse: Medical Consequences:  Liver damage, Possible death by overdose Legal Consequences:  Arrests, jail time, Loss of driving privilege. Family Consequences:  Family discord, divorce and or separation.  Previous Psychotropic Medications: Yes   Psychological Evaluations: No   Past Medical History:  Past Medical History:  Diagnosis Date  . Anxiety   . Bipolar disorder (HCC)   . Depression   . Heart murmur since birth  . Paranoid schizophrenia (HCC)   . Pilonidal cyst     Past Surgical History:  Procedure Laterality Date  . CESAREAN SECTION  2007   1x   Family History: History reviewed. No pertinent family history.  Tobacco Screening: Smokes up to a pack of cigarettes daily.  Social History:  History  Alcohol Use No    Comment: alcoholic. Sober since 02-09-16. Started drinking at age 30     History  Drug Use No    Comment: clean since 02-09-16. Started daily use at age 30    Additional Social History:   Allergies:   Allergies  Allergen Reactions  . Clindamycin/Lincomycin Nausea And Vomiting  . Other Nausea And Vomiting    NO ONIONS OR PEPPERS  Unknown antibiotic   Lab Results:  No results found for this or any previous visit (from the past 48 hour(s)).  Blood Alcohol level:  Lab Results  Component Value Date   ETH <5  03/22/2016   ETH 15 (H) 02/10/2016   Metabolic Disorder Labs:  No results found for: HGBA1C, MPG No results found for: PROLACTIN Lab Results  Component Value Date   CHOL 118 (L) 03/05/2016   TRIG 77 03/05/2016   HDL 35 (L) 03/05/2016   CHOLHDL 3.4 03/05/2016   VLDL 15 03/05/2016   LDLCALC 68 03/05/2016   Current Medications: Current Facility-Administered Medications  Medication Dose Route Frequency Provider Last Rate Last Dose  . alum & mag hydroxide-simeth (MAALOX/MYLANTA) 200-200-20 MG/5ML suspension 30 mL  30 mL Oral Q4H PRN Thermon Leyland, NP      . gabapentin (NEURONTIN) capsule 300 mg  300 mg Oral TID Thermon Leyland, NP   300 mg at 03/24/16 1156  . ibuprofen (ADVIL,MOTRIN) tablet 600 mg  600 mg Oral Q8H PRN Thermon Leyland, NP      . magnesium hydroxide (MILK OF MAGNESIA) suspension 30 mL  30 mL Oral Daily PRN Thermon Leyland, NP      .  ondansetron (ZOFRAN) tablet 4 mg  4 mg Oral Q8H PRN Thermon Leyland, NP      . Oxcarbazepine (TRILEPTAL) tablet 300 mg  300 mg Oral BID Thermon Leyland, NP   300 mg at 03/24/16 0827  . prazosin (MINIPRESS) capsule 2 mg  2 mg Oral BID Thermon Leyland, NP   2 mg at 03/24/16 0827  . risperiDONE (RISPERDAL) tablet 0.5 mg  0.5 mg Oral BID Thermon Leyland, NP   0.5 mg at 03/24/16 0827  . traZODone (DESYREL) tablet 200 mg  200 mg Oral QHS Thermon Leyland, NP   200 mg at 03/23/16 2337  . venlafaxine XR (EFFEXOR-XR) 24 hr capsule 150 mg  150 mg Oral Q breakfast Thermon Leyland, NP   150 mg at 03/24/16 0827   PTA Medications: Prescriptions Prior to Admission  Medication Sig Dispense Refill Last Dose  . acetaminophen (TYLENOL) 500 MG tablet Take 1,000 mg by mouth every 6 (six) hours as needed for mild pain.   Past Week at Unknown time  . gabapentin (NEURONTIN) 300 MG capsule Take 1 capsule (300 mg total) by mouth 3 (three) times daily. 90 capsule 3 03/22/2016 at Unknown time  . meloxicam (MOBIC) 15 MG tablet Take 1 tablet (15 mg total) by mouth daily. 30 tablet 0 03/22/2016  at Unknown time  . Oxcarbazepine (TRILEPTAL) 300 MG tablet Take 1 tablet (300 mg total) by mouth 2 (two) times daily. 60 tablet 3 03/22/2016 at Unknown time  . prazosin (MINIPRESS) 2 MG capsule Take 1 capsule (2 mg total) by mouth 2 (two) times daily. 60 capsule 3 03/22/2016 at Unknown time  . traZODone (DESYREL) 100 MG tablet Take 2 tablets (200 mg total) by mouth at bedtime as needed for sleep. (Patient taking differently: Take 200 mg by mouth at bedtime. ) 30 tablet 0 03/21/2016 at Unknown time  . venlafaxine XR (EFFEXOR-XR) 150 MG 24 hr capsule Take 1 capsule (150 mg total) by mouth daily with breakfast. 30 capsule 3 03/22/2016 at Unknown time   Musculoskeletal: Strength & Muscle Tone: within normal limits Gait & Station: normal Patient leans: N/A  Psychiatric Specialty Exam: I reviewed physical exam performed in the emergency room with the findings. Physical Exam  Nursing note and vitals reviewed. Constitutional: She appears well-developed.  HENT:  Head: Normocephalic.  Eyes: Pupils are equal, round, and reactive to light.  Neck: Normal range of motion.  Cardiovascular: Normal rate.   Respiratory: Effort normal.  GI: Soft.  Genitourinary:  Genitourinary Comments: Denies any issues in this area  Musculoskeletal: Normal range of motion.  Neurological: She is alert.  Skin: Skin is warm and dry.    Review of Systems  Constitutional: Negative.   HENT: Negative.   Eyes: Negative.   Respiratory: Negative.   Cardiovascular: Negative.   Gastrointestinal: Negative.   Genitourinary: Negative.   Musculoskeletal: Negative.   Skin: Negative.        Left foot cellulitis  Neurological: Negative.   Endo/Heme/Allergies: Negative.   Psychiatric/Behavioral: Positive for depression (Rates #0), hallucinations (Hx. Auditory/visual hallucinations) and substance abuse (Hx. Polysubstance dependence). Negative for memory loss and suicidal ideas. The patient is nervous/anxious (rates #1) and has  insomnia.   All other systems reviewed and are negative.   Blood pressure 111/62, pulse 73, temperature 98.1 F (36.7 C), temperature source Oral, resp. rate 16, height  (1.626 m), weight 79.8 kg (176 lb), last menstrual period 03/15/2016.Body mass index is 30.21 kg/m.  See MD's SRA  Treatment Plan Summary/Plan/Recommendations: 1. Admit for crisis management and stabilization, estimated length of stay 3-5 days.  2. Medication management to reduce current symptoms to base line and improve the patient's overall level of functioning  3. Treat health problems as indicated.  4. Develop treatment plan to decrease risk of relapse upon discharge and the need for readmission.  5. Psycho-social education regarding relapse prevention and self care.  6. Health care follow up as needed for medical problems.  7. Review, reconcile, and reinstate any pertinent home medications for other health issues where appropriate. 8. Call for consults with hospitalist for any additional specialty patient care services as needed.  Observation Level/Precautions:  15 minute checks  Laboratory:  Per ED  Psychotherapy: Group counseling sessions  Medications: See MAR.   Consultations:  As needed  Discharge Concerns: Mood stability   Estimated LOS: 2-4 days  Other: Admit to [facility].    I certify that inpatient services furnished can reasonably be expected to improve the patient's condition.    Sanjuana Kava, NP, PMHNP, FNP-BC 7/25/20171:31 PM

## 2016-03-24 NOTE — Progress Notes (Signed)
Pt attended evening AA group. 

## 2016-03-24 NOTE — [facility] Group Notes (Signed)
[facility] LCSW Group Therapy  03/24/2016 3:19 PM  Type of Therapy:  Group Therapy  Participation Level:  Minimal  Participation Quality:  Attentive  Affect:  Flat  Cognitive:  Oriented  Insight:  Limited  Engagement in Therapy:  Limited  Modes of Intervention:  Discussion, Education, Exploration, Limit-setting, Problem-solving, Rapport Building, Socialization and Support  Summary of Progress/Problems: MHA Speaker came to talk about his personal journey with substance abuse and addiction. The pt processed ways by which to relate to the speaker. MHA speaker provided handouts and educational information pertaining to groups and services offered by the [facility].   Smart, Veralyn Lopp LCSW 03/24/2016, 3:19 PM
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
## 2016-03-24 NOTE — Progress Notes (Addendum)
Carly Drake rates Anxiety 0/0 and Depression 4-5/10. Her goal was "to be discharged today but I got kicked out my rehab house". She states she has been 40 days sober/clean and intends on going back into some type of rehab. Denies SI/HI/AVH at this time. Contracts for safety. Encouragement and support given. Medications administered as prescribed. Will continue to monitor Q for patient safety and medication effectiveness.

## 2016-03-24 NOTE — Tx Team (Signed)
Interdisciplinary Treatment Plan Update (Adult)  Date:  03/24/2016  Time Reviewed:  9:45 AM   Progress in Treatment: Attending groups: Yes. Participating in groups:  Yes. Taking medication as prescribed:  Yes. Tolerating medication:  Yes. Family/Significant othe contact made:  SPE required for this pt.  Patient understands diagnosis:  Yes. and As evidenced by:  seeking treatment for AVH, depression, anxiety, and for medication stabilization. Discussing patient identified problems/goals with staff:  Yes. Medical problems stabilized or resolved:  Yes. Denies suicidal/homicidal ideation: Yes. Issues/concerns per patient self-inventory:  Other:  Discharge Plan or Barriers: CSW assessing for appropriate referrals.   Reason for Continuation of Hospitalization: Anxiety Depression Hallucinations Medication stabilization  Comments:  Carly Drake is a 30 y.o. female with a history of depression, anxiety, hallucinations, and substance use who presented voluntarily to [facility] with complaint of auditory/visual hallucination.  Pt is currently living in a group home (half-way) house and is participating in an IOP substance use program at [facility] (Pt previously used crack cocaine and alcohol).  Pt reported as follows:For the past few days, Pt has experienced auditory hallucinations -- voices telling her that "people are out to get me" -- that she is beginning to answer/respond to.  She also reported seeing shadows moving on the wall.  As client described, it was apparent to author that Pt may have had poor insight into the nature of her hallucinations -- for a while, she resisted them, but now she reported responding to them.  In addition to auditory/visual hallucination, Pt also endorsed persistent and unremitting despondency, growing anxiety, insomnia, poor appetite, social isolation, and growing fatigue and hopelessness.  Pt denied current suicidal ideation, but she reported that she has been  hospitalized on three occasions for suicidal ideation, and that in 2014, she made a suicide attempt (cutting).Pt reported that she recently got sober from daily use of alcohol and crack cocaine (UDS and BAC were clear).  She lives in halfway house and is currently unemployed.  Pt has a 49 year old daughter. She said that a staff member at the halfway house brought her to the hospital and that she is afraid she will not be safe if there is not an adjustment to her medications.Diagnosis: Major Depressive Disorder, Severe, with psychotic symptoms  Estimated length of stay:  2-3 days   New goal(s): to develop effective aftercare plan.   Additional Comments:  Patient and CSW reviewed pt's identified goals and treatment plan. Patient verbalized understanding and agreed to treatment plan. CSW reviewed [facility] "Discharge Process and Patient Involvement" Form. Pt verbalized understanding of information provided and signed form.    Review of initial/current patient goals per problem list:  1. Goal(s): Patient will participate in aftercare plan  OAC:ZYSA progressing.    Target date: at discharge  As evidenced by: Patient will participate within aftercare plan AEB aftercare provider and housing plan at discharge being identified.  7/25: Pt reports that she goes to [facility] outpatient for medication management and SAIOP. CSW assessing for additional resources.   2. Goal (s): Patient will exhibit decreased depressive symptoms and suicidal ideations.  Met: No.    Target date: at discharge  As evidenced by: Patient will utilize self rating of depression at 3 or below and demonstrate decreased signs of depression or be deemed stable for discharge by MD.  7/24: Pt rates depression as 8/10 today and presents with depressed mood/anxious affect. Denies SI/HI.   3. Goal(s): Patient will demonstrate decreased signs and symptoms of anxiety.  Met:No.  Target date: at discharge  As evidenced by: Patient  will utilize self rating of anxiety at 3 or below and demonstrated decreased signs of anxiety, or be deemed stable for discharge by MD  7/25: Pt rates anxiety as high. Denies SI/HI.   4. Goal(s): Patient will demonstrate decreased signs of psychosis.   Met:  Target date:at discharge   As evidenced by: Patient will report no AVH and demonstrate clear, oriented thought process by discharge/return to baseline.  7/25: Pt continues to endorse some AVH this morning. Thought process is oriented. Goal improving.    Attendees: Patient:   03/24/2016 9:45 AM   Family:   03/24/2016 9:45 AM   Physician:  Dr. Shea Evans; Dr. Parke Poisson MD 03/24/2016 9:45 AM   Nursing:   Lisbeth Renshaw RN 03/24/2016 9:45 AM   Clinical Social Worker: Maxie Better, LCSW 03/24/2016 9:45 AM   Clinical Social Worker: Erasmo Downer Drinkard LCSW; Peri Maris LCSWA 03/24/2016 9:45 AM   Other:  Gerline Legacy Nurse Case Manager 03/24/2016 9:45 AM   Other:  Agustina Caroli NP 03/24/2016 9:45 AM   Other:   03/24/2016 9:45 AM   Other:  03/24/2016 9:45 AM   Other:  03/24/2016 9:45 AM   Other:  03/24/2016 9:45 AM    03/24/2016 9:45 AM    03/24/2016 9:45 AM    03/24/2016 9:45 AM    03/24/2016 9:45 AM    Scribe for Treatment Team:   Maxie Better, LCSW 03/24/2016 9:45 AM

## 2016-03-25 LAB — LIPID PANEL
CHOL/HDL RATIO: 4.8 ratio
CHOLESTEROL: 178 mg/dL (ref 0–200)
HDL: 37 mg/dL — AB (ref >40)
LDL Cholesterol: 86 mg/dL (ref 0–99)
TRIGLYCERIDES: 273 mg/dL — AB (ref <150)
VLDL: 55 mg/dL — ABNORMAL HIGH (ref 0–40)

## 2016-03-25 LAB — TSH: TSH: 3.464 u[IU]/mL (ref 0.350–4.500)

## 2016-03-25 NOTE — Progress Notes (Signed)
Adult Psychoeducational Group Note  Date:  03/25/2016 Time:  10:06 PM  Group Topic/Focus:  Wrap-Up Group:   The focus of this group is to help patients review their daily goal of treatment and discuss progress on daily workbooks.   Participation Level:  Active  Participation Quality:  Appropriate and Sharing  Affect:  Appropriate  Cognitive:  Alert, Appropriate and Oriented  Insight: Appropriate  Engagement in Group:  Engaged  Modes of Intervention:  Education  Additional Comments:  Patient attend group. Carly Drake W Carly Drake 03/25/2016, 10:06 PM

## 2016-03-25 NOTE — [facility] Group Notes (Signed)
Patient attended group

## 2016-03-25 NOTE — [facility] Group Notes (Signed)
[facility] LCSW Group Therapy  03/25/2016 3:44 PM  Type of Therapy:  Group Therapy  Participation Level:  Active  Participation Quality:  Attentive  Affect:  Appropriate  Cognitive:  Alert and Oriented  Insight:  Improving  Engagement in Therapy:  Improving  Modes of Intervention:  Discussion, Education, Exploration, Limit-setting, Problem-solving, Rapport Building, Socialization and Support  Summary of Progress/Problems: Today's Topic: Overcoming Obstacles. Patients identified one short term goal and potential obstacles in reaching this goal. Patients processed barriers involved in overcoming these obstacles. Patients identified steps necessary for overcoming these obstacles and explored motivation (internal and external) for facing these difficulties head on. Maysie was attentive and engaged during today's processing group. She shared that her biggest obstacle is getting into treatment from here. Patient and CSW discussed options and lack thereof, due to being clean for over one month. [facility] was given [facility] and encouraged to begin calling tonight. She verbalized understanding of the chances being low of being accepted to [facility] residential. Pt reports that she still wants to move to [facility] and plans to get ID through the [facility] at discharge. Patient was attentive and pleasant during group. She was receptive to feed back from group members and was willing to problem solve with CSW.  Smart, Khalifa Knecht LCSW 03/25/2016, 3:44 PM

## 2016-03-25 NOTE — Progress Notes (Signed)
D: Though Carly Drake didn't have trouble falling asleep last night, her quality of sleep wasn't great. She has been appropriate in the milieu today. She denied SI/HI/AVH. She was disappointed this a.m that it appears she's lost her spot at the [facility] house, but she has been exploring alternatives. She appeared to be coping appropriately and adapting to the news. On her self inventory, she reported good appetite, normal energy level, and good concentration. She rated her depression, feeling of hopelessness, and anxiety all a 4.  A: Meds given as ordered. Q15 safety checks maintained. Support/encouragement offered. R: Pt remains free from harm and continues with treatment. Will continue to monitor for needs/safety.

## 2016-03-25 NOTE — Plan of Care (Signed)
Problem: Health Behavior/Discharge Planning: Goal: Compliance with prescribed medication regimen will improve Outcome: Progressing Pt has been compliant with scheduled medication tonight.

## 2016-03-25 NOTE — Progress Notes (Signed)
D: Pt presents with appropriate affect and pleasant mood.  Pt was in the dayroom upon initial approach.  She describes her day as "all right."  Pt reports her goal is "finding another place.  I have an appointment Wednesday at [facility] and my sponsor is working with me.  The social worker gave me the [facility] numbers."  Pt reports she is "60 days clean."  Pt denies SI/HI.  When asked if she is having hallucinations, she reports "I've been feeling like somebody's touching me but they're not."  Pt denies visual hallucinations, denies auditory hallucinations, denies pain, denies withdrawal symptoms.  Pt attended evening group.  She has been visible in milieu interacting appropriately with staff and peers.    A: Introduced self to pt.  Actively listened to pt and offered support and encouragement.  Medication administered per order.    R: Pt is compliant with medications.  She verbally contracts for safety and reports she will inform staff of needs and concerns.  Will continue to monitor and assess.

## 2016-03-25 NOTE — Plan of Care (Signed)
Problem: Activity: Goal: Interest or engagement in activities will improve Outcome: Progressing Pt went off unit for recreation activities. Goal: Sleeping patterns will improve Outcome: Not Progressing Pt reported poor sleep.  Problem: Coping: Goal: Ability to verbalize frustrations and anger appropriately will improve Outcome: Progressing Eryanna handled news that she could not return to [facility] well. She was disappointed but began exploring alternative discharge plans.  Problem: Health Behavior/Discharge Planning: Goal: Identification of resources available to assist in meeting health care needs will improve Outcome: Progressing See above.  Problem: Education: Goal: Will be free of psychotic symptoms Outcome: Adequate for Discharge Pt reports she no longer is experiencing AVH.  Problem: Activity: Goal: Interest or engagement in leisure activities will improve Outcome: Progressing Pt has participated in unit recreation.  Problem: Health Behavior/Discharge Planning: Goal: Compliance with therapeutic regimen will improve Outcome: Progressing Pt has taken all prescribed meds and attended groups.

## 2016-03-25 NOTE — Progress Notes (Signed)
Recreation Therapy Notes  Date: 03/25/16 Time: 0930 Location: 300 Hall Group Room  Group Topic: Stress Management  Goal Area(s) Addresses:  Patient will verbalize importance of using healthy stress management.  Patient will identify positive emotions associated with healthy stress management.   Behavioral Response: Engaged  Intervention: Stress Management  Activity :  Progressive Muscle Relaxation.  LRT introduced the technique of progressive muscle relaxation to the patients.  LRT read a script to the patients for them to engage in the activity.  Pt were encouraged to follow along as the LRT read the script.  Education:  Stress Management, Discharge Planning.   Education Outcome: Acknowledges edcuation/In group clarification offered/Needs additional education  Clinical Observations/Feedback: Pt attended group.   Caroll Rancher, LRT/CTRS     Lillia Abed, Ashlea Dusing A 03/25/2016 1:32 PM
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
## 2016-03-26 ENCOUNTER — Encounter: Payer: Self-pay | Admitting: Cardiology

## 2016-03-26 DIAGNOSIS — F333 Major depressive disorder, recurrent, severe with psychotic symptoms: Principal | ICD-10-CM

## 2016-03-26 LAB — HEMOGLOBIN A1C
HEMOGLOBIN A1C: 5.1 % (ref 4.8–5.6)
MEAN PLASMA GLUCOSE: 100 mg/dL

## 2016-03-26 MED ORDER — GABAPENTIN 300 MG PO CAPS
300.0000 mg | ORAL_CAPSULE | Freq: Three times a day (TID) | ORAL | Status: DC
  Administered 2016-03-26 – 2016-03-27 (×4): 300 mg via ORAL
  Filled 2016-03-26: qty 1
  Filled 2016-03-26: qty 56
  Filled 2016-03-26: qty 1
  Filled 2016-03-26: qty 56
  Filled 2016-03-26 (×3): qty 1
  Filled 2016-03-26 (×2): qty 56
  Filled 2016-03-26 (×2): qty 1

## 2016-03-26 NOTE — Progress Notes (Deleted)
Clinical Summary Carly Drake is a 30 y.o.female   Past Medical History:  Diagnosis Date  . Anxiety   . Bipolar disorder (HCC)   . Depression   . Heart murmur since birth  . Paranoid schizophrenia (HCC)   . Pilonidal cyst      Allergies  Allergen Reactions  . Clindamycin/Lincomycin Nausea And Vomiting  . Other Nausea And Vomiting    NO ONIONS OR PEPPERS  Unknown antibiotic     No current facility-administered medications for this visit.    No current outpatient prescriptions on file.   Facility-Administered Medications Ordered in Other Visits  Medication Dose Route Frequency Provider Last Rate Last Dose  . alum & mag hydroxide-simeth (MAALOX/MYLANTA) 200-200-20 MG/5ML suspension 30 mL  30 mL Oral Q4H PRN Carly Leyland, NP   30 mL at 03/26/16 0858  . gabapentin (NEURONTIN) capsule 300 mg  300 mg Oral TID Carly Leyland, NP   300 mg at 03/26/16 1202  . ibuprofen (ADVIL,MOTRIN) tablet 600 mg  600 mg Oral Q8H PRN Carly Leyland, NP   600 mg at 03/25/16 1441  . magnesium hydroxide (MILK OF MAGNESIA) suspension 30 mL  30 mL Oral Daily PRN Carly Leyland, NP   30 mL at 03/26/16 0858  . nicotine (NICODERM CQ - dosed in mg/24 hours) patch 21 mg  21 mg Transdermal Daily Carly Longs, MD   21 mg at 03/26/16 0836  . ondansetron (ZOFRAN) tablet 4 mg  4 mg Oral Q8H PRN Carly Leyland, NP      . Oxcarbazepine (TRILEPTAL) tablet 300 mg  300 mg Oral BID Carly Leyland, NP   300 mg at 03/26/16 0837  . prazosin (MINIPRESS) capsule 2 mg  2 mg Oral BID Carly Leyland, NP   2 mg at 03/26/16 0836  . risperiDONE (RISPERDAL) tablet 0.5 mg  0.5 mg Oral BID Carly Leyland, NP   0.5 mg at 03/26/16 0836  . traZODone (DESYREL) tablet 200 mg  200 mg Oral QHS Carly Leyland, NP   200 mg at 03/25/16 2120  . venlafaxine XR (EFFEXOR-XR) 24 hr capsule 150 mg  150 mg Oral Q breakfast Carly Leyland, NP   150 mg at 03/26/16 4098     Past Surgical History:  Procedure Laterality Date  . CESAREAN SECTION  2007   1x     Allergies  Allergen Reactions  . Clindamycin/Lincomycin Nausea And Vomiting  . Other Nausea And Vomiting    NO ONIONS OR PEPPERS  Unknown antibiotic      Family History  Problem Relation Age of Onset  . Alcohol abuse Mother   . Drug abuse Mother   . Alcohol abuse Father      Social History Carly Drake reports that she has been smoking Cigarettes.  She has a 19.00 pack-year smoking history. She has never used smokeless tobacco. Carly Drake reports that she does not drink alcohol.   Review of Systems CONSTITUTIONAL: No weight loss, fever, chills, weakness or fatigue.  HEENT: Eyes: No visual loss, blurred vision, double vision or yellow sclerae.No hearing loss, sneezing, congestion, runny nose or sore throat.  SKIN: No rash or itching.  CARDIOVASCULAR:  RESPIRATORY: No shortness of breath, cough or sputum.  GASTROINTESTINAL: No anorexia, nausea, vomiting or diarrhea. No abdominal pain or blood.  GENITOURINARY: No burning on urination, no polyuria NEUROLOGICAL: No headache, dizziness, syncope, paralysis, ataxia, numbness or tingling in the extremities. No change in bowel or  bladder control.  MUSCULOSKELETAL: No muscle, back pain, joint pain or stiffness.  LYMPHATICS: No enlarged nodes. No history of splenectomy.  PSYCHIATRIC: No history of depression or anxiety.  ENDOCRINOLOGIC: No reports of sweating, cold or heat intolerance. No polyuria or polydipsia.  Marland Kitchen   Physical Examination There were no vitals filed for this visit. There were no vitals filed for this visit.  Gen: resting comfortably, no acute distress HEENT: no scleral icterus, pupils equal round and reactive, no palptable cervical adenopathy,  CV Resp: Clear to auscultation bilaterally GI: abdomen is soft, non-tender, non-distended, normal bowel sounds, no hepatosplenomegaly MSK: extremities are warm, no edema.  Skin: warm, no rash Neuro:  no focal deficits Psych: appropriate affect   Diagnostic  Studies     Assessment and Plan        Antoine Poche, M.D., F.A.C.C.

## 2016-03-26 NOTE — Progress Notes (Signed)
[facility] MD Progress Note  03/26/2016 1:12 PM Carly Drake  MRN:  [id number]  Subjective: I'm not sleeping well at night.  I'm also having the feeling high dreams. It is scaring me because it is making me want to use again. That tells me that I'm not well. I'm a little upset today. They Kicked me out of the [facility]. The news dampened my mood earlier. But, I feel a lot better now that the Social worker is trying to get me to the [facility]. I have a screening appointment date for next Wednesday".  Objective: Patient is seen and chart reviewed. She is visible on the unit. She is attending group sessions. She is working with the Child psychotherapist for placement for further substance abuse treatment after discharge. She is currently denying any new issues other than feeling upset for getting kicked out of the [facility]. Will increase her Gabapentin from tid to qid.  Principal Problem: MDD (major depressive disorder), recurrent, severe, with psychosis (HCC)  Diagnosis:   Patient Active Problem List   Diagnosis Date Noted  . PTSD (post-traumatic stress disorder) [F43.10] 03/24/2016  . MDD (major depressive disorder), recurrent, severe, with psychosis (HCC) [F33.3] 03/23/2016  . Cellulitis [L03.90] 02/11/2016  . Alcohol use disorder, moderate, dependence (HCC) [F10.20] 02/11/2016  . Cocaine use disorder, moderate, dependence (HCC) [F14.20] 02/11/2016  . Tobacco use disorder [F17.200] 02/11/2016   Total Time spent with patient: 15 minutes  Past Psychiatric History: Polysubstance dependence  Past Medical History:  Past Medical History:  Diagnosis Date  . Anxiety   . Bipolar disorder (HCC)   . Depression   . Heart murmur since birth  . Paranoid schizophrenia (HCC)   . Pilonidal cyst     Past Surgical History:  Procedure Laterality Date  . CESAREAN SECTION  2007   1x   Family History:  Family History  Problem Relation Age of Onset  . Alcohol abuse Mother   . Drug abuse  Mother   . Alcohol abuse Father    Family Psychiatric  History: See H&P  Social History:  History  Alcohol Use No    Comment: alcoholic. Sober since 02-09-16. Started drinking at age 29     History  Drug Use No    Comment: clean since 02-09-16. Started daily use at age 74    Social History   Social History  . Marital status: Single    Spouse name: N/A  . Number of children: N/A  . Years of education: N/A   Social History Main Topics  . Smoking status: Current Every Day Smoker    Packs/day: 1.00    Years: 19.00    Types: Cigarettes  . Smokeless tobacco: Never Used  . Alcohol use No     Comment: alcoholic. Sober since 02-09-16. Started drinking at age 65  . Drug use: No     Comment: clean since 02-09-16. Started daily use at age 19  . Sexual activity: Not Currently    Birth control/ protection: None   Other Topics Concern  . None   Social History Narrative  . None   Additional Social History:   Sleep: Good  Appetite:  Good  Current Medications: Current Facility-Administered Medications  Medication Dose Route Frequency Provider Last Rate Last Dose  . alum & mag hydroxide-simeth (MAALOX/MYLANTA) 200-200-20 MG/5ML suspension 30 mL  30 mL Oral Q4H PRN Thermon Leyland, NP   30 mL at 03/26/16 0858  . gabapentin (NEURONTIN) capsule 300 mg  300 mg  Oral TID Thermon Leyland, NP   300 mg at 03/26/16 1202  . ibuprofen (ADVIL,MOTRIN) tablet 600 mg  600 mg Oral Q8H PRN Thermon Leyland, NP   600 mg at 03/25/16 1441  . magnesium hydroxide (MILK OF MAGNESIA) suspension 30 mL  30 mL Oral Daily PRN Thermon Leyland, NP   30 mL at 03/26/16 0858  . nicotine (NICODERM CQ - dosed in mg/24 hours) patch 21 mg  21 mg Transdermal Daily Jomarie Longs, MD   21 mg at 03/26/16 0836  . ondansetron (ZOFRAN) tablet 4 mg  4 mg Oral Q8H PRN Thermon Leyland, NP      . Oxcarbazepine (TRILEPTAL) tablet 300 mg  300 mg Oral BID Thermon Leyland, NP   300 mg at 03/26/16 0837  . prazosin (MINIPRESS) capsule 2 mg  2 mg  Oral BID Thermon Leyland, NP   2 mg at 03/26/16 0836  . risperiDONE (RISPERDAL) tablet 0.5 mg  0.5 mg Oral BID Thermon Leyland, NP   0.5 mg at 03/26/16 0836  . traZODone (DESYREL) tablet 200 mg  200 mg Oral QHS Thermon Leyland, NP   200 mg at 03/25/16 2120  . venlafaxine XR (EFFEXOR-XR) 24 hr capsule 150 mg  150 mg Oral Q breakfast Thermon Leyland, NP   150 mg at 03/26/16 1610    Lab Results:  Results for orders placed or performed during the hospital encounter of 03/23/16 (from the past 48 hour(s))  TSH     Status: None   Collection Time: 03/25/16  6:48 AM  Result Value Ref Range   TSH 3.464 0.350 - 4.500 uIU/mL    Comment: Performed at [facility]  Lipid panel     Status: Abnormal   Collection Time: 03/25/16  6:48 AM  Result Value Ref Range   Cholesterol 178 0 - 200 mg/dL   Triglycerides 960 (H) <150 mg/dL   HDL 37 (L) >45 mg/dL   Total CHOL/HDL Ratio 4.8 RATIO   VLDL 55 (H) 0 - 40 mg/dL   LDL Cholesterol 86 0 - 99 mg/dL    Comment:        Total Cholesterol/HDL:CHD Risk Coronary Heart Disease Risk Table                     Men   Women  1/2 Average Risk   3.4   3.3  Average Risk       5.0   4.4  2 X Average Risk   9.6   7.1  3 X Average Risk  23.4   11.0        Use the calculated Patient Ratio above and the CHD Risk Table to determine the patient's CHD Risk.        ATP III CLASSIFICATION (LDL):  <100     mg/dL   Optimal  409-811  mg/dL   Near or Above                    Optimal  130-159  mg/dL   Borderline  914-782  mg/dL   High  >956     mg/dL   Very High Performed at [facility]   Hemoglobin A1c     Status: None   Collection Time: 03/25/16  6:48 AM  Result Value Ref Range   Hgb A1c MFr Bld 5.1 4.8 - 5.6 %    Comment: (NOTE)  Pre-diabetes: 5.7 - 6.4         Diabetes: >6.4         Glycemic control for adults with diabetes: <7.0    Mean Plasma Glucose 100 mg/dL    Comment: (NOTE) Performed At: [facility] [address]  [place], [place] [id number] Mila Homer MD [id number] Performed at [facility]     Blood Alcohol level:  Lab Results  Component Value Date   ETH <5 03/22/2016   ETH 15 (H) 02/10/2016   Metabolic Disorder Labs: Lab Results  Component Value Date   HGBA1C 5.1 03/25/2016   MPG 100 03/25/2016   No results found for: PROLACTIN Lab Results  Component Value Date   CHOL 178 03/25/2016   TRIG 273 (H) 03/25/2016   HDL 37 (L) 03/25/2016   CHOLHDL 4.8 03/25/2016   VLDL 55 (H) 03/25/2016   LDLCALC 86 03/25/2016   LDLCALC 68 03/05/2016   Physical Findings: AIMS: Facial and Oral Movements Muscles of Facial Expression: None, normal Lips and Perioral Area: None, normal Jaw: None, normal Tongue: None, normal,Extremity Movements Upper (arms, wrists, hands, fingers): None, normal Lower (legs, knees, ankles, toes): None, normal, Trunk Movements Neck, shoulders, hips: None, normal, Overall Severity Severity of abnormal movements (highest score from questions above): None, normal Incapacitation due to abnormal movements: None, normal Patient's awareness of abnormal movements (rate only patient's report): No Awareness, Dental Status Current problems with teeth and/or dentures?: Yes Does patient usually wear dentures?: No  CIWA:    COWS:     Musculoskeletal: Strength & Muscle Tone: within normal limits Gait & Station: normal Patient leans: N/A  Psychiatric Specialty Exam: Physical Exam:   ROS  Blood pressure 108/63, pulse 72, temperature 97.8 F (36.6 C), temperature source Oral, resp. rate 18, height 5\' 4"  (1.626 m), weight 79.8 kg (176 lb), last menstrual period 03/15/2016.Body mass index is 30.21 kg/m.  General Appearance: Casual  Eye Contact:  Good  Speech:  Clear and Coherent  Volume:  Normal  Mood:  "Improving"  Affect:  Congruent  Thought Process:  Coherent  Orientation:  Full (Time, Place, and Person)  Thought Content:  Rumination  Suicidal  Thoughts:  Denies any thoughts, plans or intent  Homicidal Thoughts:  Denies  Memory:  Immediate;   Good Recent;   Good Remote;   Good  Judgement:  Fair  Insight:  Present  Psychomotor Activity:  Normal  Concentration:  Concentration: Good and Attention Span: Good  Recall:  Good  Fund of Knowledge:  Fair  Language:  Good  Akathisia:  No  Handed:  Right  AIMS (if indicated):     Assets:  Desire for Improvement  ADL's:  Intact  Cognition:  WNL  Sleep:  Number of Hours: 6.5   Treatment Plan Summary: Daily contact with patient to assess and evaluate symptoms and progress in treatment and Medication management: 1. Continue crisis management, mood stabilization & relapse prevention.. 2. Continue current medication management to reduce current symptoms to base line and improve the  patient's overall level of functioning;  Agitation: Increased gabapentin 300 mg qid. Mood swings: Continue the Trileptal 300 mg bid. PTSD relate nightmares: Continue the Prazosin 2 mg Q hs. Mood instability: Continue the Risperdal 0.5 mg bid. Depression: Continue the Effexor XR 150 mg. Insomnia: Continue the Trazodone 200 mg Q HS. 3. Treat health problems as indicated. 4. Develop treatment plan to enhance medication adeherance upon discharge & prevent the need for  readmission. 5. Psycho-social education  regarding relapse prevention and self care. 6. Will continue PRN treatment regimen per protocols. 7.Monitor vital signs, review pertinent findings or order labs when necessary. 8. Social Worker to work on discharge disposition  Sanjuana Kava, NP, PMHNP, FNP-BC 03/26/2016, 1:12 PM

## 2016-03-26 NOTE — Progress Notes (Signed)
D: Patient approached nurse at 1700 "I keep going to the bathroom."  Reported 5 loose stools since this AM with most recent one having frank blood in it- nurse did not observe, requested patient leave stool in toilet next time.  Denied lightheadedness.  BP manually sitting 92/48 pulse 72, standing 88/50 pulse 94.  Reports history of hemorrhoids.  Reports weakness. A:  Held prazosin for BP.  Spoke with Dr. Barbee Cough, reported information noted above.  Recommended to recheck BP in 2 hours, encourage water.  Patient given meal tray with banana, applesauce, and bread.  Instructed to get staff for help if she feels dizzy, and to not stand on her own. R: Patient reports feeling better.  Denies loose stools since before dinner.  Report given to [facility] RN to follow up on BP and loose stools.

## 2016-03-26 NOTE — Progress Notes (Signed)
D: Pt was at the nurse's station upon initial approach.  She has appropriate affect and pleasant mood.  When asked how her day was, pt states "terrible at first, but I'm feeling better now."  Pt reports her goal was "to talk to my case worker but I was sleeping."  Pt denies SI/HI, denies hallucinations, denies pain.  She attended and participated in evening karaoke group.  Pt was smiling and laughing during group.  A: Met with pt and offered support and encouragement.  Actively listened to pt.  Medications administered per order.  PO fluids encouraged and provided.    R: Pt is compliant with medications.  She verbally contracts for safety.  Will continue to monitor and assess.

## 2016-03-26 NOTE — Progress Notes (Signed)
Nursing Note 03/26/2016 [id number]  Data Reports sleeping fair without PRN sleep med, but said she was "tossing and turning" last night.  Rates depression 0/10, hopelessness 0/10, and anxiety 0/10. Affect blunted but appropriate, euthymic. Denies HI, SI, AVH.  C/O constipation, indigestion, and bloating this AM.   Action Spoke with patient 1:1, nurse offered support to patient throughout shift.  Given Maalox, milk of magnesia.  Continues to be monitored on 15 minute checks for safety.  Response Positive response with maalox and milk of magnesia.  Patient reported several loose stools which decreased by lunch time.  Reported relief with bloating.  Remains safe and appropriate on unit.

## 2016-03-26 NOTE — [facility] Group Notes (Signed)
[facility] LCSW Group Therapy  03/26/2016 4:42 PM  Type of Therapy:  Group Therapy  Participation Level:  None-pt quickly fell asleep during group and did not participate in discussion.   Summary of Progress/Problems:  Finding Balance in Life. Today's group focused on defining balance in one's own words, identifying things that can knock one off balance, and exploring healthy ways to maintain balance in life. Group members were asked to provide an example of a time when they felt off balance, describe how they handled that situation,and process healthier ways to regain balance in the future. Group members were asked to share the most important tool for maintaining balance that they learned while at [facility] and how they plan to apply this method after discharge.   Smart, Tena Linebaugh LCSW 03/26/2016, 4:42 PM

## 2016-03-26 NOTE — [facility] Group Notes (Signed)
Adult Psychoeducational Group Note  Date:  03/26/2016 Time:  0940  Group Topic/Focus:  Goals Group:   The focus of this group is to help patients establish daily goals to achieve during treatment and discuss how the patient can incorporate goal setting into their daily lives to aide in recovery.  Additionally asked to have patient identify one strength or ability.   Participation Level:  Active  Participation Quality:  Appropriate, Attentive, Sharing and Supportive  Affect:  Appropriate  Cognitive:  Alert, Appropriate and Oriented  Insight: Good  Engagement in Group:  Engaged and Supportive  Modes of Intervention:  Discussion, Education and Rapport Building  Additional Comments:  Patient shared her goal today was to "get into [facility]  She stated her strength was "getting along with everyone."  Other peers supportive, one told her she had a "magnetic personality."  Antoine Primas, RN 03/26/2016, 10:56 AM

## 2016-03-26 NOTE — Progress Notes (Signed)
Patient did attend the evening karaoke group. Pt was engaged, supportive, and participated by singing multiple songs.   

## 2016-03-27 MED ORDER — LOPERAMIDE HCL 2 MG PO CAPS
2.0000 mg | ORAL_CAPSULE | ORAL | Status: DC | PRN
  Filled 2016-03-27: qty 1

## 2016-03-27 MED ORDER — OXCARBAZEPINE 300 MG PO TABS: 300.0000 mg | ORAL_TABLET | Freq: Two times a day (BID) | ORAL | 0 refills | Status: DC

## 2016-03-27 MED ORDER — NICOTINE 21 MG/24HR TD PT24: 21.0000 mg | MEDICATED_PATCH | Freq: Every day | TRANSDERMAL | 0 refills | Status: DC

## 2016-03-27 MED ORDER — POLYVINYL ALCOHOL 1.4 % OP SOLN: 1.0000 [drp] | Freq: Three times a day (TID) | OPHTHALMIC | 0 refills | Status: DC | PRN

## 2016-03-27 MED ORDER — HYPROMELLOSE (GONIOSCOPIC) 2.5 % OP SOLN: 1.0000 [drp] | Freq: Three times a day (TID) | OPHTHALMIC | Status: DC | PRN

## 2016-03-27 MED ORDER — RISPERIDONE 0.5 MG PO TABS: 0.5000 mg | ORAL_TABLET | Freq: Two times a day (BID) | ORAL | 0 refills | Status: DC

## 2016-03-27 MED ORDER — PRAZOSIN HCL 2 MG PO CAPS: 2.0000 mg | ORAL_CAPSULE | Freq: Two times a day (BID) | ORAL | 0 refills | Status: DC

## 2016-03-27 MED ORDER — POLYVINYL ALCOHOL 1.4 % OP SOLN: 1.0000 [drp] | Freq: Three times a day (TID) | OPHTHALMIC | Status: DC | PRN

## 2016-03-27 MED ORDER — LOPERAMIDE HCL 2 MG PO CAPS: 2.0000 mg | ORAL_CAPSULE | ORAL | 0 refills | Status: DC | PRN

## 2016-03-27 MED ORDER — TRAZODONE HCL 100 MG PO TABS: 200.0000 mg | ORAL_TABLET | Freq: Every day | ORAL | 0 refills | Status: DC

## 2016-03-27 MED ORDER — VENLAFAXINE HCL ER 150 MG PO CP24: 150.0000 mg | ORAL_CAPSULE | Freq: Every day | ORAL | 0 refills | Status: DC

## 2016-03-27 MED ORDER — GABAPENTIN 300 MG PO CAPS: 300.0000 mg | ORAL_CAPSULE | Freq: Three times a day (TID) | ORAL | 0 refills | Status: DC

## 2016-03-27 NOTE — Progress Notes (Signed)
Pt discharged home with a bus pass. Pt was ambulatory, stable at the time. Pt denied SI/HI. Pt given her paper work, prescriptions and valuable returned. Pt was appreciative, no complain reported.

## 2016-03-27 NOTE — Tx Team (Signed)
Interdisciplinary Treatment Plan Update (Adult)  Date:  03/27/2016  Time Reviewed:  9:52 AM   Progress in Treatment: Attending groups: Yes. Participating in groups:  Yes. Taking medication as prescribed:  Yes. Tolerating medication:  Yes. Family/Significant othe contact made:  SPE completed with pt; pt declined to consent to family contact.  Patient understands diagnosis:  Yes. and As evidenced by:  seeking treatment for AVH, depression, anxiety, and for medication stabilization. Discussing patient identified problems/goals with staff:  Yes. Medical problems stabilized or resolved:  Yes. Denies suicidal/homicidal ideation: Yes. Issues/concerns per patient self-inventory:  Other:  Discharge Plan or Barriers: Pt is d/cing to [facility] in [place] and plans to follow-up with the [employer] if she chooses to remain in [place]. Pt has [facility] screening on Wed at 8am but is aware that she likely does not meet criteria. She also has been given [facility] info to get ID and Mental Health Association/AA/NA pamphlets for additional support in the community. Pt is not eligible for return to [place] per Juliann Pulse Civil engineer, contracting) due to her behavioral issues.   Reason for Continuation of Hospitalization: none  Comments:  Carly Drake is a 30 y.o. female with a history of depression, anxiety, hallucinations, and substance use who presented voluntarily to [facility] with complaint of auditory/visual hallucination.  Pt is currently living in a group home (half-way) house and is participating in an IOP substance use program at [facility] (Pt previously used crack cocaine and alcohol).  Pt reported as follows:For the past few days, Pt has experienced auditory hallucinations -- voices telling her that "people are out to get me" -- that she is beginning to answer/respond to.  She also reported seeing shadows moving on the wall.  As client described, it was apparent to author that Pt may have  had poor insight into the nature of her hallucinations -- for a while, she resisted them, but now she reported responding to them.  In addition to auditory/visual hallucination, Pt also endorsed persistent and unremitting despondency, growing anxiety, insomnia, poor appetite, social isolation, and growing fatigue and hopelessness.  Pt denied current suicidal ideation, but she reported that she has been hospitalized on three occasions for suicidal ideation, and that in 2014, she made a suicide attempt (cutting).Pt reported that she recently got sober from daily use of alcohol and crack cocaine (UDS and BAC were clear).  She lives in halfway house and is currently unemployed.  Pt has a 5 year old daughter. She said that a staff member at the halfway house brought her to the hospital and that she is afraid she will not be safe if there is not an adjustment to her medications.Diagnosis: Major Depressive Disorder, Severe, with psychotic symptoms  Estimated length of stay:  D/c today   Additional Comments:  Patient and CSW reviewed pt's identified goals and treatment plan. Patient verbalized understanding and agreed to treatment plan. CSW reviewed [facility] "Discharge Process and Patient Involvement" Form. Pt verbalized understanding of information provided and signed form.    Review of initial/current patient goals per problem list:  1. Goal(s): Patient will participate in aftercare plan  Met:Yes  Target date: at discharge  As evidenced by: Patient will participate within aftercare plan AEB aftercare provider and housing plan at discharge being identified.  7/25: Pt reports that she goes to [facility] outpatient for medication management and SAIOP. CSW assessing for additional resources.   7/28: Pt plans to d/c to shelter in [facility]. [facility] on  Wed at 8am.   2. Goal (s): Patient will exhibit decreased depressive symptoms and suicidal ideations.  Met: Yes   Target date: at  discharge  As evidenced by: Patient will utilize self rating of depression at 3 or below and demonstrate decreased signs of depression or be deemed stable for discharge by MD.  7/24: Pt rates depression as 8/10 today and presents with depressed mood/anxious affect. Denies SI/HI.   7/28: Pt rates depression as 3/10 and presents with pleasant mood/excited affect. She is reporting that she is ready for discharge today.   3. Goal(s): Patient will demonstrate decreased signs and symptoms of anxiety.  Met: Yes  Target date: at discharge  As evidenced by: Patient will utilize self rating of anxiety at 3 or below and demonstrated decreased signs of anxiety, or be deemed stable for discharge by MD  7/25: Pt rates anxiety as high. Denies SI/HI.   7/28: Pt rates anxiety as 3/10 and presents with pleasant mood/excited affect. Pt reports feeling ready for discharge. Pleasant and cooperative.   4. Goal(s): Patient will demonstrate decreased signs of psychosis.   Met:Yes  Target date:at discharge   As evidenced by: Patient will report no AVH and demonstrate clear, oriented thought process by discharge/return to baseline.  7/25: Pt continues to endorse some AVH this morning. Thought process is oriented. Goal improving.   7/28: Pt reports no AVH and demonstrates clear/oriented thought process. Pt appears to be presenting at her baseline.    Attendees: Patient:   03/27/2016 9:52 AM   Family:   03/27/2016 9:52 AM   Physician:  Dr. Shea Evans; Dr. Parke Poisson MD 03/27/2016 9:52 AM   Nursing:   Lovey Newcomer RN 03/27/2016 9:52 AM   Clinical Social Worker: Maxie Better, LCSW 03/27/2016 9:52 AM   Clinical Social Worker: Erasmo Downer Drinkard LCSW 03/27/2016 9:52 AM   Other:   03/27/2016 9:52 AM   Other:  May Augustin NP 03/27/2016 9:52 AM   Other:   03/27/2016 9:52 AM   Other:  03/27/2016 9:52 AM   Other:  03/27/2016 9:52 AM   Other:  03/27/2016 9:52 AM    03/27/2016 9:52 AM    03/27/2016 9:52 AM    03/27/2016 9:52  AM    03/27/2016 9:52 AM    Scribe for Treatment Team:   Maxie Better, LCSW 03/27/2016 9:52 AM
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
## 2016-03-27 NOTE — [facility] Suicide Risk Assessment (Signed)
[facility] Discharge Suicide Risk Assessment   Principal Problem: MDD (major depressive disorder), recurrent, severe, with psychosis (HCC) Discharge Diagnoses:  Patient Active Problem List   Diagnosis Date Noted  . PTSD (post-traumatic stress disorder) [F43.10] 03/24/2016  . MDD (major depressive disorder), recurrent, severe, with psychosis (HCC) [F33.3] 03/23/2016  . Cellulitis [L03.90] 02/11/2016  . Alcohol use disorder, moderate, dependence (HCC) [F10.20] 02/11/2016  . Cocaine use disorder, moderate, dependence (HCC) [F14.20] 02/11/2016  . Tobacco use disorder [F17.200] 02/11/2016    Total Time spent with patient: 30 minutes  Musculoskeletal: Strength & Muscle Tone: within normal limits Gait & Station: normal Patient leans: N/A  Psychiatric Specialty Exam: Review of Systems  Psychiatric/Behavioral: Positive for substance abuse. Negative for depression.  All other systems reviewed and are negative.   Blood pressure 102/68, pulse (!) 102, temperature 98.2 F (36.8 C), temperature source Oral, resp. rate 20, height 5\' 4"  (1.626 m), weight 79.8 kg (176 lb), last menstrual period 03/15/2016.Body mass index is 30.21 kg/m.  General Appearance: Casual  Eye Contact::  Fair  Speech:  Clear and Coherent409  Volume:  Normal  Mood:  Euthymic  Affect:  Appropriate  Thought Process:  Goal Directed and Descriptions of Associations: Intact  Orientation:  Full (Time, Place, and Person)  Thought Content:  Logical  Suicidal Thoughts:  No  Homicidal Thoughts:  No  Memory:  Immediate;   Fair Recent;   Fair Remote;   Fair  Judgement:  Fair  Insight:  Fair  Psychomotor Activity:  Normal  Concentration:  Fair  Recall:  [employer] of Knowledge:Fair  Language: Fair  Akathisia:  No  Handed:  Right  AIMS (if indicated):     Assets:  Desire for Improvement  Sleep:  Number of Hours: 6  Cognition: WNL  ADL's:  Intact   Mental Status Per Nursing Assessment::   On Admission:  NA  Demographic  Factors:  Caucasian  Loss Factors: NA  Historical Factors: Impulsivity  Risk Reduction Factors:   Positive social support  Continued Clinical Symptoms:  Alcohol/Substance Abuse/Dependencies  Cognitive Features That Contribute To Risk:  None    Suicide Risk:  Minimal: No identifiable suicidal ideation.  Patients presenting with no risk factors but with morbid ruminations; may be classified as minimal risk based on the severity of the depressive symptoms  Follow-up Information    [facility] .   Specialty:  Behavioral Health Why:  Walk in between 8am-9am Monday through Friday for hospital follow-up/medication management/assessment for counseling services.  Contact information: [address] [place] [address] [phone]        [facility] Follow up on 04/01/2016.   Why:  Screening for possible admission on this date at 8:00AM. Please bring photo ID/[place] address, clothing, and medications to this appt. (Please note: due to having over one month of sobriety, you may not meet criteria for inpatient admission).  Contact information: [address] [place] [address] [phone]           Plan Of Care/Follow-up recommendations:  Activity:  no restrictions Diet:  regular Tests:  as needed Other:  follow up with aftercare  Deloyd Handy, MD 03/27/2016, 9:47 AM

## 2016-03-27 NOTE — [facility] Suicide Risk Assessment (Signed)
[facility] INPATIENT:  Family/Significant Other Suicide Prevention Education  Suicide Prevention Education:  Patient Refusal for Family/Significant Other Suicide Prevention Education: The patient Carly Drake has refused to provide written consent for family/significant other to be provided Family/Significant Other Suicide Prevention Education during admission and/or prior to discharge.  Physician notified.  SPE completed with pt, as pt refused to consent to family contact. SPI pamphlet provided to pt and pt was encouraged to share information with support network, ask questions, and talk about any concerns relating to SPE. Pt denies access to guns/firearms and verbalized understanding of information provided. Mobile Crisis information also provided to pt.   Smart, Ferris Tally LCSW 03/27/2016, 9:49 AM

## 2016-03-27 NOTE — Plan of Care (Signed)
Problem: Safety: Goal: Periods of time without injury will increase Outcome: Progressing Pt has not harmed self tonight.  She denies SI and verbally contracts for safety.    

## 2016-03-27 NOTE — Progress Notes (Signed)
  [facility] Adult Case Management Discharge Plan :  Will you be returning to the same living situation after discharge:  No. Pt is staying in [facility] (pt aware she likely will not meet criteria for admission and has been also pursuing [facility]-list provided)  At discharge, do you have transportation home?: Yes,  bus and PART money in chart Do you have the ability to pay for your medications: Yes,  mental health  Release of information consent forms completed and in the chart;  Patient's signature needed at discharge.  Patient to Follow up at: Follow-up Information    [facility] .   Specialty:  Behavioral Health Why:  Walk in between 8am-9am Monday through Friday for hospital follow-up/medication management/assessment for counseling services.  Contact information: [address] [place] [address] [phone]        [facility] Follow up on 04/01/2016.   Why:  Screening for possible admission on this date at 8:00AM. Please bring photo ID/[place] address, clothing, and medications to this appt. (Please note: due to having over one month of sobriety, you may not meet criteria for inpatient admission).  Contact information: [address] [place] [address] [phone]        [facility] .   Why:  Open Access Clinic: Walk in between 8am-3pm Monday through Friday for assessment/mental health services if you choose to remain in [place], [place]. Please bring ID if you have it.Thank you. Contact information: [address] [place], [place] [address] Phone: [phone] Fax: [phone]          Next level of care provider has access to [facility] Link:no  Safety Planning and Suicide Prevention discussed: Yes,  SPE completed with pt; pt declined to consent to family contact .SPI pamphlet and Mobile Crisis information provided to pt.   Have you used any form of tobacco in the last 30 days?  (Cigarettes, Smokeless Tobacco, Cigars, and/or Pipes): Yes  Has patient been referred to the Quitline?: Patient refused referral  Patient has been referred for addiction treatment: Yes  Smart, Mabell Esguerra LCSW 03/27/2016, 9:49 AM

## 2016-03-30 ENCOUNTER — Encounter (HOSPITAL_COMMUNITY): Payer: Self-pay | Admitting: Nurse Practitioner

## 2016-03-30 ENCOUNTER — Emergency Department (HOSPITAL_COMMUNITY)
Admission: EM | Admit: 2016-03-30 | Discharge: 2016-03-30 | Disposition: A | Discharge status: Home / Self Care | Payer: Self-pay | Attending: Dermatology | Admitting: Dermatology

## 2016-03-30 DIAGNOSIS — Z5321 Procedure and treatment not carried out due to patient leaving prior to being seen by health care provider: Secondary | ICD-10-CM | POA: Insufficient documentation

## 2016-03-30 DIAGNOSIS — R110 Nausea: Secondary | ICD-10-CM | POA: Insufficient documentation

## 2016-03-30 DIAGNOSIS — F17210 Nicotine dependence, cigarettes, uncomplicated: Secondary | ICD-10-CM | POA: Insufficient documentation

## 2016-03-30 LAB — CBC
HCT: 40.5 % (ref 36.0–46.0)
Hemoglobin: 13.6 g/dL (ref 12.0–15.0)
MCH: 30.1 pg (ref 26.0–34.0)
MCHC: 33.6 g/dL (ref 30.0–36.0)
MCV: 89.6 fL (ref 78.0–100.0)
PLATELETS: 237 10*3/uL (ref 150–400)
RBC: 4.52 MIL/uL (ref 3.87–5.11)
RDW: 12.9 % (ref 11.5–15.5)
WBC: 7.2 10*3/uL (ref 4.0–10.5)

## 2016-03-30 LAB — COMPREHENSIVE METABOLIC PANEL
ALK PHOS: 58 U/L (ref 38–126)
ALT: 26 U/L (ref 14–54)
AST: 25 U/L (ref 15–41)
Albumin: 4 g/dL (ref 3.5–5.0)
Anion gap: 6 (ref 5–15)
BILIRUBIN TOTAL: 0.5 mg/dL (ref 0.3–1.2)
BUN: 12 mg/dL (ref 6–20)
CALCIUM: 9.6 mg/dL (ref 8.9–10.3)
CO2: 29 mmol/L (ref 22–32)
CREATININE: 0.69 mg/dL (ref 0.44–1.00)
Chloride: 104 mmol/L (ref 101–111)
Glucose, Bld: 104 mg/dL — ABNORMAL HIGH (ref 65–99)
Potassium: 3.9 mmol/L (ref 3.5–5.1)
Sodium: 139 mmol/L (ref 135–145)
TOTAL PROTEIN: 8.1 g/dL (ref 6.5–8.1)

## 2016-03-30 LAB — I-STAT BETA HCG BLOOD, ED (MC, WL, AP ONLY)

## 2016-03-30 LAB — LIPASE, BLOOD: LIPASE: 27 U/L (ref 11–51)

## 2016-03-30 NOTE — ED Notes (Signed)
Pt. Called [facility] to go back to the room , unable to locate the pt. In the Ed waiting room, outside or in the bathrooms

## 2016-03-30 NOTE — ED Triage Notes (Signed)
She c/o 2 day history of n/v. She denies pain, fevers, bowel/bladder changes. Onset after she played with a spinning toy. She reports she has recently started new psychiatric medications, which she is unsure if this may be causing her symptoms. She is alert, breathing easily

## 2016-05-05 ENCOUNTER — Ambulatory Visit: Payer: Self-pay | Admitting: Physician Assistant

## 2016-05-14 ENCOUNTER — Encounter: Payer: Self-pay | Admitting: Physician Assistant

## 2016-08-14 ENCOUNTER — Emergency Department
Admission: EM | Admit: 2016-08-14 | Discharge: 2016-08-14 | Disposition: A | Discharge status: Home / Self Care | Payer: Self-pay | Attending: Emergency Medicine | Admitting: Emergency Medicine

## 2016-08-14 ENCOUNTER — Encounter: Payer: Self-pay | Admitting: Emergency Medicine

## 2016-08-14 DIAGNOSIS — L0501 Pilonidal cyst with abscess: Secondary | ICD-10-CM | POA: Insufficient documentation

## 2016-08-14 DIAGNOSIS — F17210 Nicotine dependence, cigarettes, uncomplicated: Secondary | ICD-10-CM | POA: Insufficient documentation

## 2016-08-14 MED ORDER — SULFAMETHOXAZOLE-TRIMETHOPRIM 800-160 MG PO TABS: 1.0000 | ORAL_TABLET | Freq: Two times a day (BID) | ORAL | 0 refills | Status: DC

## 2016-08-14 MED ORDER — LIDOCAINE HCL (PF) 1 % IJ SOLN
5.0000 mL | Freq: Once | INTRAMUSCULAR | Status: AC
  Administered 2016-08-14: 5 mL

## 2016-08-14 MED ORDER — LIDOCAINE HCL (PF) 1 % IJ SOLN
INTRAMUSCULAR | Status: AC
  Administered 2016-08-14: 5 mL
  Filled 2016-08-14: qty 5

## 2016-08-14 MED ORDER — OXYCODONE-ACETAMINOPHEN 5-325 MG PO TABS
1.0000 | ORAL_TABLET | Freq: Once | ORAL | Status: AC
  Administered 2016-08-14: 1 via ORAL
  Filled 2016-08-14: qty 1

## 2016-08-14 MED ORDER — OXYCODONE-ACETAMINOPHEN 5-325 MG PO TABS: 1.0000 | ORAL_TABLET | ORAL | 0 refills | Status: DC | PRN

## 2016-08-14 NOTE — ED Provider Notes (Signed)
[facility] Emergency Department Provider Note  ____________________________________________   First MD Initiated Contact with Patient 08/14/16 1410     (approximate)  I have reviewed the triage vital signs and the nursing notes.   HISTORY  Chief Complaint Abscess   HPI Carly Drake is a 30 y.o. female is brought to the emergency room today via EMS with an abscess to her buttocks. Patient states that she has had problems similar to this in the past but does not have any money and is not on Medicaid therefore she has not seen a Careers adviser. Patient is unaware of any fever or chills. She has been taking over-the-counter medication without any relief of her pain. Patient rates her pain as a 10 over 10.   Past Medical History:  Diagnosis Date  . Anxiety   . Bipolar disorder (HCC)   . Depression   . Heart murmur since birth  . Paranoid schizophrenia (HCC)   . Pilonidal cyst     Patient Active Problem List   Diagnosis Date Noted  . PTSD (post-traumatic stress disorder) 03/24/2016  . MDD (major depressive disorder), recurrent, severe, with psychosis (HCC) 03/23/2016  . Cellulitis 02/11/2016  . Alcohol use disorder, moderate, dependence (HCC) 02/11/2016  . Cocaine use disorder, moderate, dependence (HCC) 02/11/2016  . Tobacco use disorder 02/11/2016    Past Surgical History:  Procedure Laterality Date  . CESAREAN SECTION  2007   1x    Prior to Admission medications   Medication Sig Start Date End Date Taking? Authorizing Provider  gabapentin (NEURONTIN) 300 MG capsule Take 1 capsule (300 mg total) by mouth 4 (four) times daily - after meals and at bedtime. 03/27/16   Adonis Brook, NP  loperamide (IMODIUM) 2 MG capsule Take 1 capsule (2 mg total) by mouth as needed for diarrhea or loose stools. 03/27/16   Adonis Brook, NP  nicotine (NICODERM CQ - DOSED IN MG/24 HOURS) 21 mg/24hr patch Place 1 patch (21 mg total) onto the skin daily. 03/27/16   Adonis Brook, NP  Oxcarbazepine (TRILEPTAL) 300 MG tablet Take 1 tablet (300 mg total) by mouth 2 (two) times daily. 03/27/16   Adonis Brook, NP  oxyCODONE-acetaminophen (PERCOCET) 5-325 MG tablet Take 1 tablet by mouth every 4 (four) hours as needed for severe pain. 08/14/16   Tommi Rumps, PA-C  polyvinyl alcohol (LIQUIFILM TEARS) 1.4 % ophthalmic solution Place 1 drop into both eyes 3 (three) times daily as needed for dry eyes. 03/27/16   Jomarie Longs, MD  prazosin (MINIPRESS) 2 MG capsule Take 1 capsule (2 mg total) by mouth 2 (two) times daily. 03/27/16   Adonis Brook, NP  risperiDONE (RISPERDAL) 0.5 MG tablet Take 1 tablet (0.5 mg total) by mouth 2 (two) times daily. 03/27/16   Adonis Brook, NP  sulfamethoxazole-trimethoprim (BACTRIM DS,SEPTRA DS) 800-160 MG tablet Take 1 tablet by mouth 2 (two) times daily. 08/14/16   Tommi Rumps, PA-C  traZODone (DESYREL) 100 MG tablet Take 2 tablets (200 mg total) by mouth at bedtime. 03/27/16   Adonis Brook, NP  venlafaxine XR (EFFEXOR-XR) 150 MG 24 hr capsule Take 1 capsule (150 mg total) by mouth daily with breakfast. 03/27/16   Adonis Brook, NP    Allergies Clindamycin/lincomycin and Other  Family History  Problem Relation Age of Onset  . Alcohol abuse Mother   . Drug abuse Mother   . Alcohol abuse Father     Social History Social History  Substance Use Topics  . Smoking  status: Current Every Day Smoker    Packs/day: 1.00    Years: 19.00    Types: Cigarettes  . Smokeless tobacco: Never Used  . Alcohol use No     Comment: alcoholic. Sober since 02-09-16. Started drinking at age 30    Review of Systems Constitutional: No fever/chills Cardiovascular: Denies chest pain. Respiratory: Denies shortness of breath. Gastrointestinal:  No nausea, no vomiting.   Musculoskeletal: Negative for back pain. Skin: Positive for abscess. Neurological: Negative for headaches, focal weakness or numbness. Psychiatric:Positive major depressive  disorder, positive for PTSD.   10-point ROS otherwise negative.  ____________________________________________   PHYSICAL EXAM:  VITAL SIGNS: ED Triage Vitals  Enc Vitals Group     BP 08/14/16 1302 107/71     Pulse Rate 08/14/16 1302 94     Resp 08/14/16 1302 16     Temp 08/14/16 1302 98.2 F (36.8 C)     Temp src --      SpO2 08/14/16 1302 97 %     Weight 08/14/16 1303 160 lb (72.6 kg)     Height 08/14/16 1303 5\' 4"  (1.626 m)     Head Circumference --      Peak Flow --      Pain Score 08/14/16 1254 10     Pain Loc --      Pain Edu? --      Excl. in GC? --     Constitutional: Alert and oriented. Well appearing and in no acute distress. Eyes: Conjunctivae are normal. PERRL. EOMI. Head: Atraumatic. Nose: No congestion/rhinnorhea. Neck: No stridor.   Cardiovascular: Normal rate, regular rhythm. Grossly normal heart sounds.  Good peripheral circulation. Respiratory: Normal respiratory effort.  No retractions. Lungs CTAB. Musculoskeletal:Moves upper and lower extremities benign difficulty. Normal gait was noted. Neurologic:  Normal speech and language. No gross focal neurologic deficits are appreciated. No gait instability. Skin:  Skin is warm, dry and intact. Pilonidal abscess is noted with erythema and a fluctuant center. No active drainage is present at this time. Psychiatric: Mood and affect are normal. Speech and behavior are normal.  ____________________________________________   LABS (all labs ordered are listed, but only abnormal results are displayed)  Labs Reviewed - No data to display   PROCEDURES  Procedure(s) performed: INCISION AND DRAINAGE Performed by: Tommi Rumpshonda L Porter Moes Consent: Verbal consent obtained. Risks and benefits: risks, benefits and alternatives were discussed Type: abscess  Body area: pilonidal area  Anesthesia: local infiltration  Incision was made with a scalpel.  Local anesthetic: lidocaine 1 % without epinephrine  Anesthetic  total: 2 ml  Complexity: complex Blunt dissection to break up loculations  Drainage: purulent   Drainage amount: Moderate   Packing material: 1/4 in iodoform gauze  Patient tolerance: Patient tolerated the procedure well with no immediate complications.    Procedures  Critical Care performed: No  ____________________________________________   INITIAL IMPRESSION / ASSESSMENT AND PLAN / ED COURSE  Pertinent labs & imaging results that were available during my care of the patient were reviewed by me and considered in my medical decision making (see chart for details).    Clinical Course    Patient was given Percocet prior to incision and drainage. Patient has a low tolerance for the procedure that area was packed with this much packing as possible. Patient is follow-up with [facility] acute care or return to the emergency room in 2 days for packing removal. She was started on Bactrim DS twice a day for 10 days and given  a prescription for Percocet as needed for severe pain. Patient was given the name of the surgeon that was on call today however patient states that she will not follow up with anyone and she "does not have any Medicaid". Patient was made aware that upon  abscess is a result of a cyst that will continue to reoccur.  ____________________________________________   FINAL CLINICAL IMPRESSION(S) / ED DIAGNOSES  Final diagnoses:  Pilonidal abscess      NEW MEDICATIONS STARTED DURING THIS VISIT:  Discharge Medication List as of 08/14/2016  3:26 PM    START taking these medications   Details  oxyCODONE-acetaminophen (PERCOCET) 5-325 MG tablet Take 1 tablet by mouth every 4 (four) hours as needed for severe pain., Starting Fri 08/14/2016, Print    sulfamethoxazole-trimethoprim (BACTRIM DS,SEPTRA DS) 800-160 MG tablet Take 1 tablet by mouth 2 (two) times daily., Starting Fri 08/14/2016, Print         Note:  This document was prepared using Dragon voice  recognition software and may include unintentional dictation errors.    Tommi RumpsRhonda L Alan Drummer, PA-C 08/14/16 1716    Nita Sicklearolina Veronese, MD 08/15/16 [phone]

## 2016-08-14 NOTE — ED Triage Notes (Signed)
Pt arrived via EMS, c/o abscess on buttox

## 2016-08-14 NOTE — ED Notes (Signed)
Dressing applied to tailbone area.

## 2016-08-14 NOTE — ED Notes (Signed)
Cyst to buttock x 2 days.

## 2016-08-14 NOTE — Discharge Instructions (Signed)
You will need to call make an appointment with the surgeon on call that is listed on your papers for further treatment of your pilonidal cyst. Begin taking antibiotics as directed for the next 10 days. Percocet as needed for pain. In 2 days you will need to have your drain removed at urgent care.

## 2016-09-20 ENCOUNTER — Emergency Department
Admission: EM | Admit: 2016-09-20 | Discharge: 2016-09-21 | Disposition: A | Discharge status: Home / Self Care | Payer: Self-pay | Attending: Emergency Medicine | Admitting: Emergency Medicine

## 2016-09-20 ENCOUNTER — Encounter: Payer: Self-pay | Admitting: Emergency Medicine

## 2016-09-20 DIAGNOSIS — Z79899 Other long term (current) drug therapy: Secondary | ICD-10-CM | POA: Insufficient documentation

## 2016-09-20 DIAGNOSIS — F17210 Nicotine dependence, cigarettes, uncomplicated: Secondary | ICD-10-CM | POA: Insufficient documentation

## 2016-09-20 DIAGNOSIS — F209 Schizophrenia, unspecified: Secondary | ICD-10-CM | POA: Insufficient documentation

## 2016-09-20 LAB — URINALYSIS, COMPLETE (UACMP) WITH MICROSCOPIC
Bilirubin Urine: NEGATIVE
GLUCOSE, UA: NEGATIVE mg/dL
HGB URINE DIPSTICK: NEGATIVE
Ketones, ur: NEGATIVE mg/dL
Nitrite: NEGATIVE
Protein, ur: NEGATIVE mg/dL
SPECIFIC GRAVITY, URINE: 1.023 (ref 1.005–1.030)
pH: 6 (ref 5.0–8.0)

## 2016-09-20 LAB — COMPREHENSIVE METABOLIC PANEL
ALK PHOS: 63 U/L (ref 38–126)
ALT: 13 U/L — ABNORMAL LOW (ref 14–54)
ANION GAP: 4 — AB (ref 5–15)
AST: 28 U/L (ref 15–41)
Albumin: 3.2 g/dL — ABNORMAL LOW (ref 3.5–5.0)
BUN: 10 mg/dL (ref 6–20)
CO2: 27 mmol/L (ref 22–32)
Calcium: 8.5 mg/dL — ABNORMAL LOW (ref 8.9–10.3)
Chloride: 106 mmol/L (ref 101–111)
Creatinine, Ser: 0.77 mg/dL (ref 0.44–1.00)
GFR calc Af Amer: >60 mL/min (ref >60)
GFR calc non Af Amer: >60 mL/min (ref >60)
Glucose, Bld: 87 mg/dL (ref 65–99)
POTASSIUM: 3.7 mmol/L (ref 3.5–5.1)
SODIUM: 137 mmol/L (ref 135–145)
Total Bilirubin: 0.4 mg/dL (ref 0.3–1.2)
Total Protein: 7.5 g/dL (ref 6.5–8.1)

## 2016-09-20 LAB — CBC
HEMATOCRIT: 40.5 % (ref 35.0–47.0)
HEMOGLOBIN: 13.9 g/dL (ref 12.0–16.0)
MCH: 31.2 pg (ref 26.0–34.0)
MCHC: 34.4 g/dL (ref 32.0–36.0)
MCV: 90.7 fL (ref 80.0–100.0)
Platelets: 266 10*3/uL (ref 150–440)
RBC: 4.46 MIL/uL (ref 3.80–5.20)
RDW: 13.7 % (ref 11.5–14.5)
WBC: 7.3 10*3/uL (ref 3.6–11.0)

## 2016-09-20 LAB — URINE DRUG SCREEN, QUALITATIVE (ARMC ONLY)
Amphetamines, Ur Screen: NOT DETECTED
Barbiturates, Ur Screen: NOT DETECTED
Benzodiazepine, Ur Scrn: NOT DETECTED
Cannabinoid 50 Ng, Ur ~~LOC~~: NOT DETECTED
Cocaine Metabolite,Ur ~~LOC~~: POSITIVE — AB
MDMA (ECSTASY) UR SCREEN: NOT DETECTED
Methadone Scn, Ur: NOT DETECTED
OPIATE, UR SCREEN: NOT DETECTED
Phencyclidine (PCP) Ur S: NOT DETECTED
Tricyclic, Ur Screen: NOT DETECTED

## 2016-09-20 LAB — ACETAMINOPHEN LEVEL: Acetaminophen (Tylenol), Serum: <10 ug/mL — ABNORMAL LOW (ref 10–30)

## 2016-09-20 LAB — SALICYLATE LEVEL

## 2016-09-20 LAB — PREGNANCY, URINE: Preg Test, Ur: NEGATIVE

## 2016-09-20 LAB — ETHANOL: Alcohol, Ethyl (B): <5 mg/dL (ref <5)

## 2016-09-20 MED ORDER — ACETAMINOPHEN 500 MG PO TABS
1000.0000 mg | ORAL_TABLET | Freq: Once | ORAL | Status: AC
  Administered 2016-09-20: 1000 mg via ORAL
  Filled 2016-09-20: qty 2
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
## 2016-09-20 NOTE — ED Notes (Signed)
Patient is IVC and is pending SOC consult. 

## 2016-09-20 NOTE — ED Provider Notes (Addendum)
[facility] Emergency Department Provider Note   ____________________________________________   First MD Initiated Contact with Patient 09/20/16 2012     (approximate)  I have reviewed the triage vital signs and the nursing notes.   HISTORY  Chief Complaint Psychiatric Evaluation    HPI Carly Drake is a 31 y.o. female who reports she has paranoid schizophrenia and is out of her medications her incarceration apparently interfered with her ability to get her medicines somehow she is now hearing voices and seeing things aching crack cocaine and alcohol as well   Past Medical History:  Diagnosis Date  . Anxiety   . Bipolar disorder (HCC)   . Depression   . Heart murmur since birth  . Paranoid schizophrenia (HCC)   . Pilonidal cyst     Patient Active Problem List   Diagnosis Date Noted  . PTSD (post-traumatic stress disorder) 03/24/2016  . MDD (major depressive disorder), recurrent, severe, with psychosis (HCC) 03/23/2016  . Cellulitis 02/11/2016  . Alcohol use disorder, moderate, dependence (HCC) 02/11/2016  . Cocaine use disorder, moderate, dependence (HCC) 02/11/2016  . Tobacco use disorder 02/11/2016    Past Surgical History:  Procedure Laterality Date  . CESAREAN SECTION  2007   1x    Prior to Admission medications   Medication Sig Start Date End Date Taking? Authorizing Provider  gabapentin (NEURONTIN) 300 MG capsule Take 1 capsule (300 mg total) by mouth 4 (four) times daily - after meals and at bedtime. Patient not taking: Reported on 09/20/2016 03/27/16   Adonis BrookSheila Agustin, NP  loperamide (IMODIUM) 2 MG capsule Take 1 capsule (2 mg total) by mouth as needed for diarrhea or loose stools. Patient not taking: Reported on 09/20/2016 03/27/16   Adonis BrookSheila Agustin, NP  nicotine (NICODERM CQ - DOSED IN MG/24 HOURS) 21 mg/24hr patch Place 1 patch (21 mg total) onto the skin daily. Patient not taking: Reported on 09/20/2016 03/27/16   Adonis BrookSheila Agustin,  NP  Oxcarbazepine (TRILEPTAL) 300 MG tablet Take 1 tablet (300 mg total) by mouth 2 (two) times daily. Patient not taking: Reported on 09/20/2016 03/27/16   Adonis BrookSheila Agustin, NP  oxyCODONE-acetaminophen (PERCOCET) 5-325 MG tablet Take 1 tablet by mouth every 4 (four) hours as needed for severe pain. Patient not taking: Reported on 09/20/2016 08/14/16   Tommi Rumpshonda L Summers, PA-C  polyvinyl alcohol (LIQUIFILM TEARS) 1.4 % ophthalmic solution Place 1 drop into both eyes 3 (three) times daily as needed for dry eyes. Patient not taking: Reported on 09/20/2016 03/27/16   Jomarie LongsSaramma Eappen, MD  prazosin (MINIPRESS) 2 MG capsule Take 1 capsule (2 mg total) by mouth 2 (two) times daily. Patient not taking: Reported on 09/20/2016 03/27/16   Adonis BrookSheila Agustin, NP  risperiDONE (RISPERDAL) 0.5 MG tablet Take 1 tablet (0.5 mg total) by mouth 2 (two) times daily. Patient not taking: Reported on 09/20/2016 03/27/16   Adonis BrookSheila Agustin, NP  sulfamethoxazole-trimethoprim (BACTRIM DS,SEPTRA DS) 800-160 MG tablet Take 1 tablet by mouth 2 (two) times daily. Patient not taking: Reported on 09/20/2016 08/14/16   Tommi Rumpshonda L Summers, PA-C  traZODone (DESYREL) 100 MG tablet Take 2 tablets (200 mg total) by mouth at bedtime. Patient not taking: Reported on 09/20/2016 03/27/16   Adonis BrookSheila Agustin, NP  venlafaxine XR (EFFEXOR-XR) 150 MG 24 hr capsule Take 1 capsule (150 mg total) by mouth daily with breakfast. Patient not taking: Reported on 09/20/2016 03/27/16   Adonis BrookSheila Agustin, NP    Allergies Clindamycin/lincomycin and Other  Family History  Problem Relation Age of  Onset  . Alcohol abuse Mother   . Drug abuse Mother   . Alcohol abuse Father     Social History Social History  Substance Use Topics  . Smoking status: Current Every Day Smoker    Packs/day: 1.00    Years: 19.00    Types: Cigarettes  . Smokeless tobacco: Never Used  . Alcohol use Yes    Review of Systems Constitutional: No fever/chills Eyes: No visual changes. ENT: No  sore throat. Cardiovascular: Denies chest pain. Respiratory: Denies shortness of breath. Gastrointestinal: No abdominal pain.  No nausea, no vomiting.  No diarrhea.  No constipation. Genitourinary: Negative for dysuria. Musculoskeletal: Negative for back pain. Skin: Negative for rash.  10-point ROS otherwise negative.  ____________________________________________   PHYSICAL EXAM:  VITAL SIGNS: ED Triage Vitals [09/20/16 1959]  Enc Vitals Group     BP 119/84     Pulse Rate 100     Resp 20     Temp 98.6 F (37 C)     Temp Source Oral     SpO2      Weight 155 lb (70.3 kg)     Height 5\' 4"  (1.626 m)     Head Circumference      Peak Flow      Pain Score 8     Pain Loc      Pain Edu?      Excl. in GC?     Constitutional: Alert and oriented. Well appearing and in no acute distress. Eyes: Conjunctivae are normal. PERRL. EOMI. Head: Atraumatic. Nose: No congestion/rhinnorhea. Mouth/Throat: Mucous membranes are moist.  Oropharynx non-erythematous. Neck: No stridor. Cardiovascular: Normal rate, regular rhythm. Grossly normal heart sounds.  Good peripheral circulation. Respiratory: Normal respiratory effort.  No retractions. Lungs CTAB. Gastrointestinal: Soft and nontender. No distention. No abdominal bruits. No CVA tenderness. Musculoskeletal: No lower extremity tenderness nor edema.  No joint effusions. Neurologic:  Normal speech and language. No gross focal neurologic deficits are appreciated. No gait instability.   ____________________________________________   LABS (all labs ordered are listed, but only abnormal results are displayed)  Labs Reviewed  COMPREHENSIVE METABOLIC PANEL - Abnormal; Notable for the following:       Result Value   Calcium 8.5 (*)    Albumin 3.2 (*)    ALT 13 (*)    Anion gap 4 (*)    All other components within normal limits  URINE DRUG SCREEN, QUALITATIVE ([facility] ONLY) - Abnormal; Notable for the following:    Cocaine Metabolite,[place]  POSITIVE (*)    All other components within normal limits  URINALYSIS, COMPLETE (UACMP) WITH MICROSCOPIC - Abnormal; Notable for the following:    Color, Urine YELLOW (*)    APPearance HAZY (*)    Leukocytes, UA TRACE (*)    All other components within normal limits  ACETAMINOPHEN LEVEL - Abnormal; Notable for the following:    Acetaminophen (Tylenol), Serum <10 (*)    All other components within normal limits  ETHANOL  CBC  PREGNANCY, URINE  SALICYLATE LEVEL   ____________________________________________  EKG  ____________________________________________  RADIOLOGY   ____________________________________________   PROCEDURES  Procedure(s) performed:  Procedures  Critical Care performed: ____________________________________________   INITIAL IMPRESSION / ASSESSMENT AND PLAN / ED COURSE  Pertinent labs & imaging results that were available during my care of the patient were reviewed by me and considered in my medical decision making (see chart for details).        ____________________________________________   FINAL CLINICAL IMPRESSION(S) /  ED DIAGNOSES  Final diagnoses:  Schizophrenia, unspecified type (HCC)      NEW MEDICATIONS STARTED DURING THIS VISIT:  New Prescriptions   No medications on file     Note:  This document was prepared using Dragon voice recognition software and may include unintentional dictation errors.    Arnaldo Natal, MD 09/21/16 0120  Tele-psychiatry recommends Zyprexa 5 by mouth now Ativan 1 by mouth now for the ride home discharge patient with Zyprexa 1 by mouth at 8 PM follow-up with [facility] patient seems to be okay with this.    Arnaldo Natal, MD 09/21/16 [phone]

## 2016-09-20 NOTE — ED Notes (Signed)
Soc in progress at this time.  

## 2016-09-20 NOTE — ED Notes (Signed)
IVC papers has been initiated per Dr. Farrel GobbleMalinda's orders.

## 2016-09-20 NOTE — ED Notes (Signed)
SOC has been called per Dr. Malinda's orders. 

## 2016-09-20 NOTE — ED Notes (Signed)
[facility]

## 2016-09-20 NOTE — ED Triage Notes (Signed)
Pt states she has a history of schizophrenia and has not had access to her medication due to incarceration. Pt states she has also relapsed on etoh and crack cocaine. Pt states last used etoh and cocaine last pm. Pt is voluntary and denies active SI, but states she is hearing voices and seeing shadows.

## 2016-09-21 MED ORDER — LORAZEPAM 1 MG PO TABS
1.0000 mg | ORAL_TABLET | Freq: Once | ORAL | Status: AC
  Administered 2016-09-21: 1 mg via ORAL
  Filled 2016-09-21: qty 1

## 2016-09-21 MED ORDER — OLANZAPINE 5 MG PO TABS: 5.0000 mg | ORAL_TABLET | Freq: Every day | ORAL | 2 refills | Status: DC

## 2016-09-21 MED ORDER — OLANZAPINE 5 MG PO TABS
5.0000 mg | ORAL_TABLET | Freq: Every day | ORAL | Status: DC
  Administered 2016-09-21: 5 mg via ORAL
  Filled 2016-09-21: qty 1

## 2016-09-21 NOTE — Discharge Instructions (Signed)
Please take the Zyprexa once a day. We gave her dose for today. Please follow-up with [facility]. Please also follow-up with Alcoholics Anonymous and Narcan on or have [facility] refer you. Lee's return for any further problems

## 2018-06-10 ENCOUNTER — Emergency Department
Admission: EM | Admit: 2018-06-10 | Discharge: 2018-06-10 | Disposition: A | Discharge status: Home / Self Care | Payer: Self-pay | Attending: Emergency Medicine | Admitting: Emergency Medicine

## 2018-06-10 ENCOUNTER — Encounter: Payer: Self-pay | Admitting: Emergency Medicine

## 2018-06-10 ENCOUNTER — Other Ambulatory Visit: Payer: Self-pay

## 2018-06-10 ENCOUNTER — Emergency Department: Payer: Self-pay

## 2018-06-10 DIAGNOSIS — N83202 Unspecified ovarian cyst, left side: Secondary | ICD-10-CM | POA: Insufficient documentation

## 2018-06-10 DIAGNOSIS — R079 Chest pain, unspecified: Secondary | ICD-10-CM | POA: Insufficient documentation

## 2018-06-10 DIAGNOSIS — M542 Cervicalgia: Secondary | ICD-10-CM | POA: Insufficient documentation

## 2018-06-10 DIAGNOSIS — Y9389 Activity, other specified: Secondary | ICD-10-CM | POA: Insufficient documentation

## 2018-06-10 DIAGNOSIS — Y999 Unspecified external cause status: Secondary | ICD-10-CM | POA: Insufficient documentation

## 2018-06-10 DIAGNOSIS — S0990XA Unspecified injury of head, initial encounter: Secondary | ICD-10-CM | POA: Insufficient documentation

## 2018-06-10 DIAGNOSIS — Y929 Unspecified place or not applicable: Secondary | ICD-10-CM | POA: Insufficient documentation

## 2018-06-10 DIAGNOSIS — F17210 Nicotine dependence, cigarettes, uncomplicated: Secondary | ICD-10-CM | POA: Insufficient documentation

## 2018-06-10 DIAGNOSIS — Z79899 Other long term (current) drug therapy: Secondary | ICD-10-CM | POA: Insufficient documentation

## 2018-06-10 LAB — CBC WITH DIFFERENTIAL/PLATELET
ABS IMMATURE GRANULOCYTES: 0.02 10*3/uL (ref 0.00–0.07)
Basophils Absolute: 0.1 10*3/uL (ref 0.0–0.1)
Basophils Relative: 1 %
EOS ABS: 0.1 10*3/uL (ref 0.0–0.5)
Eosinophils Relative: 1 %
HEMATOCRIT: 44.9 % (ref 36.0–46.0)
Hemoglobin: 14.9 g/dL (ref 12.0–15.0)
IMMATURE GRANULOCYTES: 0 %
LYMPHS ABS: 2.7 10*3/uL (ref 0.7–4.0)
Lymphocytes Relative: 32 %
MCH: 30.9 pg (ref 26.0–34.0)
MCHC: 33.2 g/dL (ref 30.0–36.0)
MCV: 93.2 fL (ref 80.0–100.0)
MONO ABS: 0.5 10*3/uL (ref 0.1–1.0)
MONOS PCT: 7 %
NEUTROS ABS: 4.9 10*3/uL (ref 1.7–7.7)
NEUTROS PCT: 59 %
Platelets: 303 10*3/uL (ref 150–400)
RBC: 4.82 MIL/uL (ref 3.87–5.11)
RDW: 13.1 % (ref 11.5–15.5)
WBC: 8.2 10*3/uL (ref 4.0–10.5)
nRBC: 0 % (ref 0.0–0.2)

## 2018-06-10 LAB — COMPREHENSIVE METABOLIC PANEL
ALBUMIN: 3.7 g/dL (ref 3.5–5.0)
ALK PHOS: 51 U/L (ref 38–126)
ALT: 13 U/L (ref 0–44)
AST: 22 U/L (ref 15–41)
Anion gap: 9 (ref 5–15)
BILIRUBIN TOTAL: 0.3 mg/dL (ref 0.3–1.2)
BUN: 9 mg/dL (ref 6–20)
CO2: 21 mmol/L — ABNORMAL LOW (ref 22–32)
CREATININE: 0.67 mg/dL (ref 0.44–1.00)
Calcium: 8.9 mg/dL (ref 8.9–10.3)
Chloride: 108 mmol/L (ref 98–111)
GFR calc Af Amer: >60 mL/min (ref >60)
GLUCOSE: 89 mg/dL (ref 70–99)
POTASSIUM: 3.6 mmol/L (ref 3.5–5.1)
Sodium: 138 mmol/L (ref 135–145)
TOTAL PROTEIN: 7.9 g/dL (ref 6.5–8.1)

## 2018-06-10 LAB — ETHANOL: ALCOHOL ETHYL (B): 119 mg/dL — AB (ref <10)

## 2018-06-10 LAB — HCG, QUANTITATIVE, PREGNANCY: hCG, Beta Chain, Quant, S: <1 m[IU]/mL (ref <5)

## 2018-06-10 MED ORDER — HYDROCODONE-ACETAMINOPHEN 5-325 MG PO TABS: 1.0000 | ORAL_TABLET | Freq: Four times a day (QID) | ORAL | 0 refills | Status: DC | PRN

## 2018-06-10 MED ORDER — IOPAMIDOL (ISOVUE-300) INJECTION 61%
100.0000 mL | Freq: Once | INTRAVENOUS | Status: AC | PRN
  Administered 2018-06-10: 100 mL via INTRAVENOUS

## 2018-06-10 MED ORDER — HALOPERIDOL LACTATE 5 MG/ML IJ SOLN
2.5000 mg | Freq: Once | INTRAMUSCULAR | Status: AC
  Administered 2018-06-10: 2.5 mg via INTRAVENOUS
  Filled 2018-06-10: qty 1

## 2018-06-10 MED ORDER — FENTANYL CITRATE (PF) 100 MCG/2ML IJ SOLN
75.0000 ug | Freq: Once | INTRAMUSCULAR | Status: AC
  Administered 2018-06-10: 75 ug via INTRAVENOUS
  Filled 2018-06-10: qty 2

## 2018-06-10 MED ORDER — IBUPROFEN 600 MG PO TABS: 600.0000 mg | ORAL_TABLET | Freq: Three times a day (TID) | ORAL | 0 refills | Status: DC | PRN

## 2018-06-10 MED ORDER — KETOROLAC TROMETHAMINE 30 MG/ML IJ SOLN
15.0000 mg | Freq: Once | INTRAMUSCULAR | Status: AC
  Administered 2018-06-10: 15 mg via INTRAVENOUS
  Filled 2018-06-10: qty 1

## 2018-06-10 NOTE — ED Notes (Signed)
Pt states that most of her pain is in her neck and abdomen.
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
## 2018-07-06 ENCOUNTER — Other Ambulatory Visit: Payer: Self-pay

## 2018-07-06 ENCOUNTER — Emergency Department
Admission: EM | Admit: 2018-07-06 | Discharge: 2018-07-06 | Disposition: A | Discharge status: Home / Self Care | Payer: Self-pay | Attending: Emergency Medicine | Admitting: Emergency Medicine

## 2018-07-06 ENCOUNTER — Encounter: Payer: Self-pay | Admitting: Emergency Medicine

## 2018-07-06 DIAGNOSIS — R21 Rash and other nonspecific skin eruption: Secondary | ICD-10-CM | POA: Insufficient documentation

## 2018-07-06 DIAGNOSIS — Z79899 Other long term (current) drug therapy: Secondary | ICD-10-CM | POA: Insufficient documentation

## 2018-07-06 DIAGNOSIS — K0510 Chronic gingivitis, plaque induced: Secondary | ICD-10-CM

## 2018-07-06 DIAGNOSIS — K0501 Acute gingivitis, non-plaque induced: Secondary | ICD-10-CM | POA: Insufficient documentation

## 2018-07-06 DIAGNOSIS — A691 Other Vincent's infections: Secondary | ICD-10-CM | POA: Insufficient documentation

## 2018-07-06 DIAGNOSIS — F17210 Nicotine dependence, cigarettes, uncomplicated: Secondary | ICD-10-CM | POA: Insufficient documentation

## 2018-07-06 LAB — CBC WITH DIFFERENTIAL/PLATELET
Abs Immature Granulocytes: 0.06 10*3/uL (ref 0.00–0.07)
Basophils Absolute: 0 10*3/uL (ref 0.0–0.1)
Basophils Relative: 0 %
EOS ABS: 0.1 10*3/uL (ref 0.0–0.5)
Eosinophils Relative: 1 %
HCT: 43 % (ref 36.0–46.0)
Hemoglobin: 14.6 g/dL (ref 12.0–15.0)
IMMATURE GRANULOCYTES: 1 %
Lymphocytes Relative: 17 %
Lymphs Abs: 1.4 10*3/uL (ref 0.7–4.0)
MCH: 31 pg (ref 26.0–34.0)
MCHC: 34 g/dL (ref 30.0–36.0)
MCV: 91.3 fL (ref 80.0–100.0)
MONO ABS: 0.6 10*3/uL (ref 0.1–1.0)
MONOS PCT: 7 %
NEUTROS PCT: 74 %
Neutro Abs: 6 10*3/uL (ref 1.7–7.7)
Platelets: 315 10*3/uL (ref 150–400)
RBC: 4.71 MIL/uL (ref 3.87–5.11)
RDW: 12.6 % (ref 11.5–15.5)
WBC: 8.2 10*3/uL (ref 4.0–10.5)
nRBC: 0 % (ref 0.0–0.2)

## 2018-07-06 LAB — BASIC METABOLIC PANEL
ANION GAP: 8 (ref 5–15)
BUN: 21 mg/dL — ABNORMAL HIGH (ref 6–20)
CALCIUM: 8.8 mg/dL — AB (ref 8.9–10.3)
CO2: 24 mmol/L (ref 22–32)
Chloride: 103 mmol/L (ref 98–111)
Creatinine, Ser: 0.82 mg/dL (ref 0.44–1.00)
GFR calc Af Amer: >60 mL/min (ref >60)
GFR calc non Af Amer: >60 mL/min (ref >60)
GLUCOSE: 149 mg/dL — AB (ref 70–99)
POTASSIUM: 3.1 mmol/L — AB (ref 3.5–5.1)
Sodium: 135 mmol/L (ref 135–145)

## 2018-07-06 MED ORDER — CHLORHEXIDINE GLUCONATE 0.12 % MT SOLN: 15.0000 mL | Freq: Two times a day (BID) | OROMUCOSAL | 2 refills | Status: DC

## 2018-07-06 MED ORDER — LIDOCAINE HCL (PF) 1 % IJ SOLN
INTRAMUSCULAR | Status: AC
  Filled 2018-07-06: qty 5

## 2018-07-06 MED ORDER — IBUPROFEN 800 MG PO TABS: 800.0000 mg | ORAL_TABLET | Freq: Three times a day (TID) | ORAL | 0 refills | Status: DC | PRN

## 2018-07-06 MED ORDER — CEFTRIAXONE SODIUM 1 G IJ SOLR
1.0000 g | Freq: Once | INTRAMUSCULAR | Status: AC
  Administered 2018-07-06: 1 g via INTRAMUSCULAR
  Filled 2018-07-06: qty 10

## 2018-07-06 MED ORDER — AMOXICILLIN 500 MG PO CAPS: 500.0000 mg | ORAL_CAPSULE | Freq: Three times a day (TID) | ORAL | 0 refills | Status: DC

## 2018-07-06 NOTE — ED Notes (Signed)
See triage note  Presents with generalized rash  States she has a broken wisdom tooth  And noticed gum swelling    States rash started after her mouth problems

## 2018-07-06 NOTE — Discharge Instructions (Addendum)
Follow-up with your regular doctor or [facility] dental clinic.  They do have a brief walk-in clinic for dental care.  You have gingivitis which is started to decay your gums and the roots of your teeth.  You need to take the antibiotic that we have given you as prescribed.  You have been given a dose of antibiotics tonight.  You could get your medications filled at medication management if he cannot afford to buy them.
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
## 2018-07-06 NOTE — ED Triage Notes (Addendum)
Says has a broken wisdom tooth and has swollen gums. aslo has a rash with blisters all over. Says she cant keep anything down except coca cola, which she is drinking now in triage.

## 2018-07-08 LAB — PTT FACTOR INHIBITOR (MIXING STUDY): APTT: 28 s (ref 22.9–30.2)

## 2018-07-08 LAB — RPR, QUANT+TP ABS (REFLEX)
Rapid Plasma Reagin, Quant: 1:1 {titer} — ABNORMAL HIGH
T Pallidum Abs: NEGATIVE

## 2018-07-08 LAB — RPR: RPR Ser Ql: REACTIVE — AB

## 2019-01-30 ENCOUNTER — Encounter: Payer: Self-pay | Admitting: *Deleted

## 2019-01-30 ENCOUNTER — Other Ambulatory Visit: Payer: Self-pay

## 2019-01-30 ENCOUNTER — Telehealth: Payer: Self-pay | Admitting: Emergency Medicine

## 2019-01-30 ENCOUNTER — Emergency Department
Admission: EM | Admit: 2019-01-30 | Discharge: 2019-01-30 | Disposition: A | Discharge status: Home / Self Care | Payer: Self-pay | Attending: Emergency Medicine | Admitting: Emergency Medicine

## 2019-01-30 ENCOUNTER — Emergency Department: Payer: Self-pay

## 2019-01-30 DIAGNOSIS — Z5321 Procedure and treatment not carried out due to patient leaving prior to being seen by health care provider: Secondary | ICD-10-CM | POA: Insufficient documentation

## 2019-01-30 DIAGNOSIS — R51 Headache: Secondary | ICD-10-CM | POA: Insufficient documentation
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
## 2019-01-30 NOTE — ED Notes (Addendum)
Patient taken to ct and asked the ct tech if she was going straight to a room after ct. Patient was told that she would be going back to the lobby to wait for a room until one was available. Patient stated to the ct tech that she did not want any ct scans and that she was going to leave. Patient encouraged to stay.

## 2019-01-30 NOTE — ED Notes (Signed)
Pt not present when rounding in waiting room 

## 2019-01-30 NOTE — Telephone Encounter (Signed)
Called patient due to lwot to inquire about condition and follow up plans. Left message.   

## 2019-01-30 NOTE — ED Notes (Signed)
Pt not waiting outside nor has she returned to the waiting room

## 2020-05-30 ENCOUNTER — Other Ambulatory Visit: Payer: Self-pay

## 2020-05-30 ENCOUNTER — Emergency Department: Payer: HRSA Program

## 2020-05-30 DIAGNOSIS — U071 COVID-19: Secondary | ICD-10-CM | POA: Insufficient documentation

## 2020-05-30 DIAGNOSIS — R45851 Suicidal ideations: Secondary | ICD-10-CM | POA: Insufficient documentation

## 2020-05-30 DIAGNOSIS — F200 Paranoid schizophrenia: Secondary | ICD-10-CM | POA: Insufficient documentation

## 2020-05-30 DIAGNOSIS — F1910 Other psychoactive substance abuse, uncomplicated: Secondary | ICD-10-CM | POA: Diagnosis not present

## 2020-05-30 DIAGNOSIS — F17210 Nicotine dependence, cigarettes, uncomplicated: Secondary | ICD-10-CM | POA: Insufficient documentation

## 2020-05-30 DIAGNOSIS — R0602 Shortness of breath: Secondary | ICD-10-CM | POA: Diagnosis present

## 2020-05-30 LAB — BASIC METABOLIC PANEL
Anion gap: 8 (ref 5–15)
BUN: 12 mg/dL (ref 6–20)
CO2: 29 mmol/L (ref 22–32)
Calcium: 8.2 mg/dL — ABNORMAL LOW (ref 8.9–10.3)
Chloride: 101 mmol/L (ref 98–111)
Creatinine, Ser: 1.03 mg/dL — ABNORMAL HIGH (ref 0.44–1.00)
GFR calc Af Amer: >60 mL/min (ref >60)
GFR calc non Af Amer: >60 mL/min (ref >60)
Glucose, Bld: 99 mg/dL (ref 70–99)
Potassium: 3.6 mmol/L (ref 3.5–5.1)
Sodium: 138 mmol/L (ref 135–145)

## 2020-05-30 LAB — CBC
HCT: 39.2 % (ref 36.0–46.0)
Hemoglobin: 13.4 g/dL (ref 12.0–15.0)
MCH: 30.2 pg (ref 26.0–34.0)
MCHC: 34.2 g/dL (ref 30.0–36.0)
MCV: 88.5 fL (ref 80.0–100.0)
Platelets: 206 10*3/uL (ref 150–400)
RBC: 4.43 MIL/uL (ref 3.87–5.11)
RDW: 13.8 % (ref 11.5–15.5)
WBC: 3.5 10*3/uL — ABNORMAL LOW (ref 4.0–10.5)
nRBC: 0 % (ref 0.0–0.2)

## 2020-05-30 LAB — TROPONIN I (HIGH SENSITIVITY): Troponin I (High Sensitivity): 3 ng/L (ref <18)

## 2020-05-30 NOTE — ED Notes (Signed)
Pt found sitting in lobby, st she had been sleeping

## 2020-05-30 NOTE — ED Notes (Signed)
No answer when called several times from lobby 

## 2020-05-30 NOTE — ED Triage Notes (Signed)
Reports onset of cough, SOB X 3 days ago. Chest pain with coughing. Mild fever. +exposure to COVID.

## 2020-05-30 NOTE — ED Notes (Signed)
No answer when called several times from lobby & cell phone 
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
## 2020-05-31 ENCOUNTER — Emergency Department
Admission: EM | Admit: 2020-05-31 | Discharge: 2020-06-03 | Disposition: A | Discharge status: Home / Self Care | Payer: HRSA Program | Attending: Student in an Organized Health Care Education/Training Program | Admitting: Student in an Organized Health Care Education/Training Program

## 2020-05-31 DIAGNOSIS — R45851 Suicidal ideations: Secondary | ICD-10-CM

## 2020-05-31 DIAGNOSIS — F1420 Cocaine dependence, uncomplicated: Secondary | ICD-10-CM | POA: Diagnosis present

## 2020-05-31 DIAGNOSIS — F1910 Other psychoactive substance abuse, uncomplicated: Secondary | ICD-10-CM

## 2020-05-31 DIAGNOSIS — U071 COVID-19: Secondary | ICD-10-CM

## 2020-05-31 DIAGNOSIS — Z8659 Personal history of other mental and behavioral disorders: Secondary | ICD-10-CM

## 2020-05-31 DIAGNOSIS — F1494 Cocaine use, unspecified with cocaine-induced mood disorder: Secondary | ICD-10-CM | POA: Diagnosis present

## 2020-05-31 LAB — RESPIRATORY PANEL BY RT PCR (FLU A&B, COVID)
Influenza A by PCR: NEGATIVE
Influenza B by PCR: NEGATIVE
SARS Coronavirus 2 by RT PCR: POSITIVE — AB

## 2020-05-31 LAB — TROPONIN I (HIGH SENSITIVITY): Troponin I (High Sensitivity): 3 ng/L (ref <18)

## 2020-05-31 LAB — SALICYLATE LEVEL: Salicylate Lvl: <7 mg/dL — ABNORMAL LOW (ref 7.0–30.0)

## 2020-05-31 LAB — ETHANOL: Alcohol, Ethyl (B): <10 mg/dL (ref <10)

## 2020-05-31 LAB — ACETAMINOPHEN LEVEL: Acetaminophen (Tylenol), Serum: <10 ug/mL — ABNORMAL LOW (ref 10–30)

## 2020-05-31 MED ORDER — SERTRALINE HCL 50 MG PO TABS
50.0000 mg | ORAL_TABLET | Freq: Every day | ORAL | Status: DC
  Administered 2020-05-31 – 2020-06-03 (×4): 50 mg via ORAL
  Filled 2020-05-31 (×4): qty 1

## 2020-05-31 MED ORDER — CHLORDIAZEPOXIDE HCL 25 MG PO CAPS
25.0000 mg | ORAL_CAPSULE | Freq: Once | ORAL | Status: AC
  Administered 2020-05-31: 25 mg via ORAL
  Filled 2020-05-31: qty 1

## 2020-05-31 MED ORDER — LORAZEPAM 2 MG PO TABS
0.0000 mg | ORAL_TABLET | Freq: Four times a day (QID) | ORAL | Status: DC
  Administered 2020-05-31 – 2020-06-01 (×2): 1 mg via ORAL
  Filled 2020-05-31 (×2): qty 1

## 2020-05-31 MED ORDER — OLANZAPINE 10 MG PO TABS
10.0000 mg | ORAL_TABLET | Freq: Every day | ORAL | Status: DC
  Administered 2020-05-31: 10 mg via ORAL
  Filled 2020-05-31: qty 1

## 2020-05-31 MED ORDER — LORAZEPAM 2 MG/ML IJ SOLN: 0.0000 mg | Freq: Two times a day (BID) | INTRAMUSCULAR | Status: DC

## 2020-05-31 MED ORDER — THIAMINE HCL 100 MG PO TABS
100.0000 mg | ORAL_TABLET | Freq: Every day | ORAL | Status: DC
  Administered 2020-05-31 – 2020-06-03 (×4): 100 mg via ORAL
  Filled 2020-05-31 (×4): qty 1

## 2020-05-31 MED ORDER — LORAZEPAM 2 MG/ML IJ SOLN: 0.0000 mg | Freq: Four times a day (QID) | INTRAMUSCULAR | Status: DC

## 2020-05-31 MED ORDER — LORAZEPAM 2 MG PO TABS: 0.0000 mg | ORAL_TABLET | Freq: Two times a day (BID) | ORAL | Status: DC

## 2020-05-31 MED ORDER — THIAMINE HCL 100 MG/ML IJ SOLN: 100.0000 mg | Freq: Every day | INTRAMUSCULAR | Status: DC

## 2020-05-31 NOTE — ED Notes (Signed)
Report to include Situation, Background, Assessment, and Recommendations received from [place], [place]. Patient alert and oriented, warm and dry, in no acute distress. Patient denies SI, HI, and pain. Patient made aware of Q15 minute rounds and security cameras for their safety. Patient instructed to come to this nurse with needs or concerns.

## 2020-05-31 NOTE — ED Provider Notes (Signed)
The patient has been placed in psychiatric observation due to the need to provide a safe environment for the patient while obtaining psychiatric consultation and evaluation, as well as ongoing medical and medication management to treat the patient's condition.  The patient has been placed under full IVC at this time.    Willy Eddy, MD 05/31/20 1051

## 2020-05-31 NOTE — ED Notes (Addendum)
Pt changed into behavorial clothing by this Clinical research associate and RN [employer]. Pt belongings placed in a labeled belongings bag and placed at the [employer], the following items obtained are as follows...Marland KitchenMarland KitchenMarland Kitchen  1 black tank top 1 pinkish colored bra 1 pair of black pants, 1 pair of black croc flip flops,  1 grey colored ring with multiple clear stones in it, some stones are missing  1 orange lip ring, 1 grey colored ring with 1 big clear stone and multiple little clear stones around it, 1 grey colored square ring, 1 grey colored ring with clear stones around entire thing, 1 grey colored ring, 1 grey colored class ring from [employer], 1 yellow colored ring with hardly any stones left in the rim, 1 grey colored ring 4 prong ring with no stone, 1 grey colored ring with a crown on it with clear stones, 1 grey colored ring stone with clear stones and multiple stones missing, 1 grey colored ring with multiple clear stones in it

## 2020-05-31 NOTE — ED Notes (Signed)
PT reports auditory hallucination telling her to be careful around other individuals. Pt reports verbal hallucination and states she sees shadows.

## 2020-05-31 NOTE — BH Assessment (Addendum)
Assessment Note  Carly Drake is an 34 y.o. female who presents to the ER due to not feeling well physically. She also reports, she has had increased symptoms of depression for the last several months. In the past when she has felt like this, it resulted in her getting a knife an attempting to cut her wrist, with the intentions of ending her life. However, with this ER visit, she was trying to be proactive, so it would not lead to her cutting herself. Patient further reports, the thoughts of dying are increasing and have become more intense. Current stressors are; unemployment, currently homeless, limited resources and no support system. Patient has used alcohol and cocaine as a means to cope with the depression and stressors.  During the interview, the patient was calm cooperative and pleasant. She was able to provide appropriate answers to the questions. She reports of having no HI and AV/H and no history of violence or aggression. She's currently involved with the court system and her upcoming court date was continued by her attorney. Charges were drug related.  Diagnosis: Major Depression  Past Medical History:  Past Medical History:  Diagnosis Date  . Anxiety   . Bipolar disorder (HCC)   . Depression   . Heart murmur since birth  . Paranoid schizophrenia (HCC)   . Pilonidal cyst     Past Surgical History:  Procedure Laterality Date  . CESAREAN SECTION  2007   1x    Family History:  Family History  Problem Relation Age of Onset  . Alcohol abuse Mother   . Drug abuse Mother   . Alcohol abuse Father     Social History:  reports that she has been smoking cigarettes. She has a 19.00 pack-year smoking history. She has never used smokeless tobacco. She reports current alcohol use. She reports current drug use. Drugs: Cocaine and "Crack" cocaine.  Additional Social History:  Alcohol / Drug Use Pain Medications: See PTA Prescriptions: See PTA Over the Counter: See PTA History of  alcohol / drug use?: Yes Longest period of sobriety (when/how long): Unable to quantify Negative Consequences of Use: Personal relationships, Work / School Substance #1 Name of Substance 1: Alcohol 1 - Amount (size/oz): "Until I pass out." 1 - Frequency: Daily 1 - Duration: "About a year" 1 - Last Use / Amount: "Two days ago" Substance #2 Name of Substance 2: Cocaine 2 - Age of First Use: 14 2 - Amount (size/oz): Unable to quantify 2 - Frequency: Unable to quantify 2 - Duration: "Off and on" for 16 years 2 - Last Use / Amount: "A week ago"  CIWA: CIWA-Ar BP: 110/70 Pulse Rate: 70 COWS:    Allergies:  Allergies  Allergen Reactions  . Clindamycin/Lincomycin Nausea And Vomiting  . Other Nausea And Vomiting    NO ONIONS OR PEPPERS  Unknown antibiotic    Home Medications: (Not in a hospital admission)   OB/GYN Status:  Patient's last menstrual period was 05/30/2020.  General Assessment Data Location of Assessment: [facility] ED TTS Assessment: In system Is this a Tele or Face-to-Face Assessment?: Face-to-Face Is this an Initial Assessment or a Re-assessment for this encounter?: Initial Assessment Patient Accompanied by:: N/A Language Other than English: No Living Arrangements: Homeless/Shelter What gender do you identify as?: Female Date Telepsych consult ordered in [facility]: 05/30/20 Time Telepsych consult ordered in [facility]: 1658 Marital status: Single Pregnancy Status: No Living Arrangements: Other (Comment) (Homeless) Can pt return to current living arrangement?: Yes Admission Status: Involuntary Petitioner: ED  Attending Is patient capable of signing voluntary admission?: No (Under IVC) Referral Source: Self/Family/Friend Insurance type: None  Medical Screening Exam [facility] Walk-in ONLY) Medical Exam completed: Yes  Crisis Care Plan Living Arrangements: Other (Comment) (Homeless) Legal Guardian: Other: (Self) Name of Psychiatrist: Reports of none Name of Therapist:  Reports of none  Education Status Is patient currently in school?: No Is the patient employed, unemployed or receiving disability?: Unemployed  Risk to self with the past 6 months Suicidal Ideation: Yes-Currently Present Has patient been a risk to self within the past 6 months prior to admission? : Yes Suicidal Intent: No Has patient had any suicidal intent within the past 6 months prior to admission? : No Is patient at risk for suicide?: Yes Suicidal Plan?: Yes-Currently Present Has patient had any suicidal plan within the past 6 months prior to admission? : Yes Specify Current Suicidal Plan: Cut her wrist Access to Means: Yes What has been your use of drugs/alcohol within the last 12 months?: Alcohol & Cocaine Previous Attempts/Gestures: Yes How many times?: 4 Other Self Harm Risks: Active Substance use Triggers for Past Attempts: Other personal contacts, Other (Comment) Intentional Self Injurious Behavior: Cutting Comment - Self Injurious Behavior: History of cutting Family Suicide History: Unknown Recent stressful life event(s): Other (Comment) Persecutory voices/beliefs?: No Depression: Yes Depression Symptoms: Isolating, Tearfulness, Feeling worthless/self pity, Loss of interest in usual pleasures, Guilt Substance abuse history and/or treatment for substance abuse?: Yes Suicide prevention information given to non-admitted patients: Not applicable  Risk to Others within the past 6 months Homicidal Ideation: No Does patient have any lifetime risk of violence toward others beyond the six months prior to admission? : No Thoughts of Harm to Others: No Current Homicidal Intent: No Current Homicidal Plan: No Access to Homicidal Means: No Identified Victim: Reports of none History of harm to others?: No Assessment of Violence: None Noted Violent Behavior Description: Reports of none Does patient have access to weapons?: No Criminal Charges Pending?: No Does patient have a  court date: No Is patient on probation?: No  Psychosis Hallucinations: None noted Delusions: None noted  Mental Status Report Appearance/Hygiene: Unremarkable, In scrubs Eye Contact: Fair Motor Activity: Freedom of movement, Unremarkable Speech: Logical/coherent, Unremarkable Level of Consciousness: Alert Mood: Depressed, Sad, Pleasant Affect: Appropriate to circumstance, Depressed Anxiety Level: None Thought Processes: Coherent, Relevant Judgement: Unimpaired Orientation: Person, Place, Time, Situation, Appropriate for developmental age Obsessive Compulsive Thoughts/Behaviors: None  Cognitive Functioning Concentration: Normal Memory: Recent Intact, Remote Intact Is patient IDD: No Insight: Fair Impulse Control: Fair Appetite: Good Have you had any weight changes? : No Change Sleep: Increased Total Hours of Sleep: 12 Vegetative Symptoms: None  ADLScreening [facility] Services) Patient's cognitive ability adequate to safely complete daily activities?: Yes Patient able to express need for assistance with ADLs?: Yes Independently performs ADLs?: Yes (appropriate for developmental age)  Prior Inpatient Therapy Prior Inpatient Therapy: Yes Prior Therapy Dates: 02/2016, 01/2016 & 05/2013 Prior Therapy Facilty/Provider(s): [facility], [facility] & [facility] Reason for Treatment: Depression and Substance Abuse Disorder  Prior Outpatient Therapy Prior Outpatient Therapy: No Does patient have Intensive In-House Services?  : No Does patient have [facility] services? : No Does patient have P4CC services?: No  ADL Screening (condition at time of admission) Patient's cognitive ability adequate to safely complete daily activities?: Yes Is the patient deaf or have difficulty hearing?: No Does the patient have difficulty seeing, even when wearing glasses/contacts?: No Does the patient have difficulty concentrating, remembering, or making  decisions?: No Patient able to  express need for assistance with ADLs?: Yes Does the patient have difficulty dressing or bathing?: No Independently performs ADLs?: Yes (appropriate for developmental age) Does the patient have difficulty walking or climbing stairs?: No Weakness of Legs: None Weakness of Arms/Hands: None  Home Assistive Devices/Equipment Home Assistive Devices/Equipment: None  Therapy Consults (therapy consults require a physician order) PT Evaluation Needed: No OT Evalulation Needed: No SLP Evaluation Needed: No Abuse/Neglect Assessment (Assessment to be complete while patient is alone) Abuse/Neglect Assessment Can Be Completed: Yes Physical Abuse: Denies Verbal Abuse: Denies Sexual Abuse: Denies Exploitation of patient/patient's resources: Denies Self-Neglect: Denies Values / Beliefs Cultural Requests During Hospitalization: None Spiritual Requests During Hospitalization: None Consults Spiritual Care Consult Needed: No Transition of Care Team Consult Needed: No Advance Directives (For Healthcare) Does Patient Have a Medical Advance Directive?: No Would patient like information on creating a medical advance directive?: No - Patient declined  Disposition: Disposition pending Psych Consult from provider recommendation  Disposition Initial Assessment Completed for this Encounter: Yes  On Site Evaluation by:   Reviewed with Physician:    Lilyan Gilford MS, LCAS, [facility], [facility] Therapeutic Triage Specialist 05/31/2020 12:29 PM

## 2020-05-31 NOTE — ED Notes (Signed)
Hourly rounding reveals patient awake in room. No complaints, stable, in no acute distress. Q15 minute rounds and monitoring via Security Cameras to continue. 

## 2020-05-31 NOTE — ED Notes (Signed)
Snack and beverage given. 

## 2020-05-31 NOTE — ED Notes (Signed)
Report called to Lea RN  Pt will be moved to major

## 2020-05-31 NOTE — ED Provider Notes (Signed)
[facility] Emergency Department Provider Note  ____________________________________________   First MD Initiated Contact with Patient 05/31/20 [phone]     (approximate)  I have reviewed the triage vital signs and the nursing notes.   HISTORY  Chief Complaint Shortness of Breath and Chest Pain   HPI Carly Drake is a 34 y.o. female presents to the ED with complaint of shortness of breath, cough, chest discomfort and subjective fever for the last 4 days.  Patient denies any nausea, vomiting or diarrhea.  Patient has known Covid exposure.  She has not had a Covid vaccine.  She reports that she smokes 1 pack cigarettes per day on a regular basis.  Patient also reports that she has been asking to see someone in mental health as she feels suicidal and has had issues with suicidal ideation and attempts in the past according to patient.  She reports that she is "in a dark place".  She reports that she is fearful that she will hurt herself.  Per past medical history patient has a history of polysubstance abuse, bipolar disorder and paranoid schizophrenia.  She rates her pain as a 9 out of 10.       Past Medical History:  Diagnosis Date  . Anxiety   . Bipolar disorder (HCC)   . Depression   . Heart murmur since birth  . Paranoid schizophrenia (HCC)   . Pilonidal cyst     Patient Active Problem List   Diagnosis Date Noted  . PTSD (post-traumatic stress disorder) 03/24/2016  . MDD (major depressive disorder), recurrent, severe, with psychosis (HCC) 03/23/2016  . Cellulitis 02/11/2016  . Alcohol use disorder, moderate, dependence (HCC) 02/11/2016  . Cocaine use disorder, moderate, dependence (HCC) 02/11/2016  . Tobacco use disorder 02/11/2016    Past Surgical History:  Procedure Laterality Date  . CESAREAN SECTION  2007   1x    Prior to Admission medications   Medication Sig Start Date End Date Taking? Authorizing Provider  OLANZapine (ZYPREXA) 5 MG tablet  Take 1 tablet (5 mg total) by mouth at bedtime. 09/21/16 09/21/17  Arnaldo Natal, MD    Allergies Clindamycin/lincomycin and Other  Family History  Problem Relation Age of Onset  . Alcohol abuse Mother   . Drug abuse Mother   . Alcohol abuse Father     Social History Social History   Tobacco Use  . Smoking status: Current Every Day Smoker    Packs/day: 1.00    Years: 19.00    Pack years: 19.00    Types: Cigarettes  . Smokeless tobacco: Never Used  Vaping Use  . Vaping Use: Never used  Substance Use Topics  . Alcohol use: Yes    Comment: daily until intoxicated  . Drug use: Yes    Types: Cocaine, "Crack" cocaine    Comment: last used 01/27/19    Review of Systems Constitutional: Subjective fever/chills Eyes: No visual changes. ENT: No sore throat. Cardiovascular: Denies chest pain. Respiratory: Positive shortness of breath.  Positive cough. Gastrointestinal: No abdominal pain.  No nausea, no vomiting.  No diarrhea.   Genitourinary: Negative for dysuria. Musculoskeletal: Positive for muscle aches. Skin: Negative for rash. Neurological: Negative for headaches, focal weakness or numbness. Psychiatric:  History paranoid schizophrenia, bipolar and polysubstance abuse.  History of suicidal ideation in the past. ____________________________________________   PHYSICAL EXAM:  VITAL SIGNS: ED Triage Vitals  Enc Vitals Group     BP 05/30/20 1830 (!) 100/54     Pulse Rate  05/30/20 1830 73     Resp 05/30/20 1830 20     Temp 05/30/20 1830 99.9 F (37.7 C)     Temp Source 05/30/20 1830 Oral     SpO2 05/30/20 1830 98 %     Weight 05/30/20 1831 145 lb (65.8 kg)     Height 05/30/20 1831 5\' 4"  (1.626 m)     Head Circumference --      Peak Flow --      Pain Score 05/30/20 1831 9     Pain Loc --      Pain Edu? --      Excl. in GC? --     Constitutional: Alert and oriented.  No acute distress. Eyes: Conjunctivae are normal.  Head: Atraumatic. Nose: No  congestion/rhinnorhea. Neck: No stridor.   Cardiovascular: Normal rate, regular rhythm. Grossly normal heart sounds.  Good peripheral circulation. Respiratory: Normal respiratory effort.  No retractions. Lungs no wheezing is noted.  Patient did have a dry cough while being examined. Gastrointestinal: Soft and nontender. No distention.  Musculoskeletal: Moves upper and lower extremities any difficulty.  No edema present. Neurologic:  Normal speech and language. No gross focal neurologic deficits are appreciated.  Skin:  Skin is warm, dry and intact. No rash noted. Psychiatric: Poor eye contact.  Patient is talkative and cooperative.  ____________________________________________   LABS (all labs ordered are listed, but only abnormal results are displayed)  Labs Reviewed  RESPIRATORY PANEL BY RT PCR (FLU A&B, COVID) - Abnormal; Notable for the following components:      Result Value   SARS Coronavirus 2 by RT PCR POSITIVE (*)    All other components within normal limits  BASIC METABOLIC PANEL - Abnormal; Notable for the following components:   Creatinine, Ser 1.03 (*)    Calcium 8.2 (*)    All other components within normal limits  CBC - Abnormal; Notable for the following components:   WBC 3.5 (*)    All other components within normal limits  ACETAMINOPHEN LEVEL  ETHANOL  SALICYLATE LEVEL  URINE DRUG SCREEN, QUALITATIVE ([facility] ONLY)  URINALYSIS, COMPLETE (UACMP) WITH MICROSCOPIC  POC URINE PREG, ED  TROPONIN I (HIGH SENSITIVITY)  TROPONIN I (HIGH SENSITIVITY)   ____________________________________________  EKG   ____________________________________________  RADIOLOGY   Official radiology report(s): DG Chest 2 View  Result Date: 05/30/2020 CLINICAL DATA:  Shortness of breath with cough and fever. EXAM: CHEST - 2 VIEW COMPARISON:  05/13/2015 FINDINGS: Patchy areas of airspace opacity are identified in both lung bases, right greater than left. No pulmonary edema or pleural  effusion. No pneumothorax. The cardiopericardial silhouette is within normal limits for size. The visualized bony structures of the thorax show no acute abnormality. IMPRESSION: Patchy airspace opacity in both lung bases, right greater than left. Imaging features compatible with multifocal pneumonia. Electronically Signed   By: 07/13/2015 M.D.   On: 05/30/2020 18:55    ____________________________________________   PROCEDURES  Procedure(s) performed (including Critical Care):  Procedures   ____________________________________________   INITIAL IMPRESSION / ASSESSMENT AND PLAN / ED COURSE  As part of my medical decision making, I reviewed the following data within the electronic MEDICAL RECORD NUMBER Notes from prior ED visits and  Controlled Substance Database  34 year old female presents to the ED with complaint of cough, shortness of breath for the last 3 days and known exposure to Covid.  Patient has had a subjective fever at home.  She is a 1 pack-a-day smoker.  Patient is positive  for Covid and chest x-ray findings consistent with Covid pneumonia.  Patient also expresses that she currently has suicidal ideation and has had a history of the same.  She requested to speak to someone in mental health.  She reports that she is concerned that she will hurt herself.  A consult for TTS/psychiatry was ordered and patient was taken to a major room for psychiatric work-up and consultation.  Patient was made aware that her Covid test is positive prior to being moved. ____________________________________________   FINAL CLINICAL IMPRESSION(S) / ED DIAGNOSES  Final diagnoses:  COVID-19 virus infection  Suicidal ideation  History of suicidal ideation  Polysubstance abuse [facility])     ED Discharge Orders    None      *Please note:  Carly Drake was evaluated in Emergency Department on 05/31/2020 for the symptoms described in the history of present illness. She was evaluated in the context of  the global COVID-19 pandemic, which necessitated consideration that the patient might be at risk for infection with the SARS-CoV-2 virus that causes COVID-19. Institutional protocols and algorithms that pertain to the evaluation of patients at risk for COVID-19 are in a state of rapid change based on information released by regulatory bodies including the CDC and federal and state organizations. These policies and algorithms were followed during the patient's care in the ED.  Some ED evaluations and interventions may be delayed as a result of limited staffing during and the pandemic.*   Note:  This document was prepared using Dragon voice recognition software and may include unintentional dictation errors.    Tommi Rumps, PA-C 05/31/20 1124    Willy Eddy, MD 05/31/20 1126

## 2020-05-31 NOTE — ED Notes (Signed)
COVID + per the lab

## 2020-05-31 NOTE — ED Notes (Signed)
Called   No answer in lobby  

## 2020-05-31 NOTE — ED Notes (Signed)
IVC papers placed by Dr. Roxan Hockey. TTS camera placed in patient's room and call placed to Calvin, TTS. Patient is alert and oriented. Will continue to monitor.

## 2020-05-31 NOTE — ED Notes (Signed)
Hourly rounding reveals patient asleep in room. No complaints, stable, in no acute distress. Q15 minute rounds and monitoring via Security Cameras to continue. 

## 2020-05-31 NOTE — ED Notes (Addendum)
See triage note  Presents with SOB,cough and chest discomfort  Pt states sx's started 4 days ago   Was tested positive last pm for COVID  Also would like to speak with TTS  States she is suicidal no plan at present

## 2020-05-31 NOTE — Consult Note (Signed)
[facility] Face-to-Face Psychiatry Consult   Reason for Consult:  Possible Self harm previous ongoing history --on IVC  Referring Physician:   ED MD  Patient Identification: Carly Drake MRN:  [id number] Principal Diagnosis: <principal problem not specified> Diagnosis:  Active Problems:   * No active hospital problems. *  Major depression with psychosis Generalized anxiety  ETOH withdrawal, dependence  Currently COVID positive with pneumonia findings     Total Time spent with patient:  Subjective:   Carly Drake is a 34 y.o. female patient admitted with  ETOH dependence and withdrawal   And dual diagnosis issues    HPI:   Caucasian female with major depression with psychosis needing ETOH detox.  Drank two days ago and before that not sober for many months.  Wakes up with shakes and tremors now worsening/   Also with cocaine dependence   Past Psychiatric History:  Several admission including rehab one year ago  Has been on streets for one year  Now covid Positive.    Continuous drinking and cocaine use.  Has lost custody of 36 year old and 36 year old  Has not been in program or shelter since then   Has shakes, tremors and feels nauseated clammy and sweaty   She previously scrapes and cuts wrists and arms but now came in to prevent this  She is stressed by homelessness no ID, no programming or structure.  Not clear if she regularly takes her Olanzapine  Currently not suicidal --contracts for safety but depression symptoms are present.  She has not taken antidepressants probably from lack of thought and follow up     Risk to Self: Suicidal Ideation: Yes-Currently Present Suicidal Intent: No Is patient at risk for suicide?: Yes Suicidal Plan?: Yes-Currently Present Specify Current Suicidal Plan: Cut her wrist Access to Means: Yes What has been your use of drugs/alcohol within the last 12 months?: Alcohol & Cocaine How many times?: 4 Other Self Harm Risks: Active Substance  use Triggers for Past Attempts: Other personal contacts, Other (Comment) Intentional Self Injurious Behavior: Cutting Comment - Self Injurious Behavior: History of cutting Risk to Others: Homicidal Ideation: No Thoughts of Harm to Others: No Current Homicidal Intent: No Current Homicidal Plan: No Access to Homicidal Means: No Identified Victim: Reports of none History of harm to others?: No Assessment of Violence: None Noted Violent Behavior Description: Reports of none Does patient have access to weapons?: No Criminal Charges Pending?: No Does patient have a court date: No Prior Inpatient Therapy: Prior Inpatient Therapy: Yes Prior Therapy Dates: 02/2016, 01/2016 & 05/2013 Prior Therapy Facilty/Provider(s): [facility], [facility] & [facility] Reason for Treatment: Depression and Substance Abuse Disorder Prior Outpatient Therapy: Prior Outpatient Therapy: No Does patient have Intensive In-House Services?  : No Does patient have [facility] services? : No Does patient have P4CC services?: No  Past Medical History:  Past Medical History:  Diagnosis Date  . Anxiety   . Bipolar disorder (HCC)   . Depression   . Heart murmur since birth  . Paranoid schizophrenia (HCC)   . Pilonidal cyst     Past Surgical History:  Procedure Laterality Date  . CESAREAN SECTION  2007   1x   Family History:  None she says  Family History  Problem Relation Age of Onset  . Alcohol abuse Mother   . Drug abuse Mother   . Alcohol abuse Father    Family Psychiatric  History: none she says  Social History:  Social History  Substance and Sexual Activity  Alcohol Use Yes   Comment: daily until intoxicated     Social History   Substance and Sexual Activity  Drug Use Yes  . Types: Cocaine, "Crack" cocaine   Comment: last used 01/27/19    Social History   Socioeconomic History  . Marital status: Married    Spouse name: Not on file  . Number of children: Not on file  . Years of education:  Not on file  . Highest education level: Not on file  Occupational History  . Not on file  Tobacco Use  . Smoking status: Current Every Day Smoker    Packs/day: 1.00    Years: 19.00    Pack years: 19.00    Types: Cigarettes  . Smokeless tobacco: Never Used  Vaping Use  . Vaping Use: Never used  Substance and Sexual Activity  . Alcohol use: Yes    Comment: daily until intoxicated  . Drug use: Yes    Types: Cocaine, "Crack" cocaine    Comment: last used 01/27/19  . Sexual activity: Yes    Birth control/protection: None  Other Topics Concern  . Not on file  Social History Narrative  . Not on file   Social Determinants of Health   Financial Resource Strain:   . Difficulty of Paying Living Expenses: Not on file  Food Insecurity:   . Worried About Programme researcher, broadcasting/film/video in the Last Year: Not on file  . Ran Out of Food in the Last Year: Not on file  Transportation Needs:   . Lack of Transportation (Medical): Not on file  . Lack of Transportation (Non-Medical): Not on file  Physical Activity:   . Days of Exercise per Week: Not on file  . Minutes of Exercise per Session: Not on file  Stress:   . Feeling of Stress : Not on file  Social Connections:   . Frequency of Communication with Friends and Family: Not on file  . Frequency of Social Gatherings with Friends and Family: Not on file  . Attends Religious Services: Not on file  . Active Member of Clubs or Organizations: Not on file  . Attends Banker Meetings: Not on file  . Marital Status: Not on file   Additional Social History: no recent shelters or supportive half way houses     Allergies:   Allergies  Allergen Reactions  . Clindamycin/Lincomycin Nausea And Vomiting  . Other Nausea And Vomiting    NO ONIONS OR PEPPERS  Unknown antibiotic    Labs:  Results for orders placed or performed during the hospital encounter of 05/31/20 (from the past 48 hour(s))  Basic metabolic panel     Status: Abnormal    Collection Time: 05/30/20  6:34 PM  Result Value Ref Range   Sodium 138 135 - 145 mmol/L   Potassium 3.6 3.5 - 5.1 mmol/L   Chloride 101 98 - 111 mmol/L   CO2 29 22 - 32 mmol/L   Glucose, Bld 99 70 - 99 mg/dL    Comment: Glucose reference range applies only to samples taken after fasting for at least 8 hours.   BUN 12 6 - 20 mg/dL   Creatinine, Ser 5.46 (H) 0.44 - 1.00 mg/dL   Calcium 8.2 (L) 8.9 - 10.3 mg/dL   GFR calc non Af Amer >60 >60 mL/min   GFR calc Af Amer >60 >60 mL/min   Anion gap 8 5 - 15    Comment: Performed at [facility]  [facility], [address] [place], [place] [address]  CBC     Status: Abnormal   Collection Time: 05/30/20  6:34 PM  Result Value Ref Range   WBC 3.5 (L) 4.0 - 10.5 K/uL   RBC 4.43 3.87 - 5.11 MIL/uL   Hemoglobin 13.4 12.0 - 15.0 g/dL   HCT 83.3 36 - 46 %   MCV 88.5 80.0 - 100.0 fL   MCH 30.2 26.0 - 34.0 pg   MCHC 34.2 30.0 - 36.0 g/dL   RDW 38.3 29.1 - 91.6 %   Platelets 206 150 - 400 K/uL   nRBC 0.0 0.0 - 0.2 %    Comment: Performed at [facility], [address] [place], [place] [address]  Troponin I (High Sensitivity)     Status: None   Collection Time: 05/30/20  6:34 PM  Result Value Ref Range   Troponin I (High Sensitivity) 3 <18 ng/L    Comment: (NOTE) Elevated high sensitivity troponin I (hsTnI) values and significant  changes across serial measurements may suggest ACS but many other  chronic and acute conditions are known to elevate hsTnI results.  Refer to the "Links" section for chest pain algorithms and additional  guidance. Performed at [facility], [address] [place], [place] [address]   Troponin I (High Sensitivity)     Status: None   Collection Time: 05/30/20  8:34 PM  Result Value Ref Range   Troponin I (High Sensitivity) 3 <18 ng/L    Comment: (NOTE) Elevated high sensitivity troponin I (hsTnI) values and significant  changes across serial measurements may suggest ACS but many other   chronic and acute conditions are known to elevate hsTnI results.  Refer to the "Links" section for chest pain algorithms and additional  guidance. Performed at [facility], [address] [place], [place] [address]   Respiratory Panel by RT PCR (Flu A&B, Covid) - Nasopharyngeal Swab     Status: Abnormal   Collection Time: 05/31/20  4:44 AM   Specimen: Nasopharyngeal Swab  Result Value Ref Range   SARS Coronavirus 2 by RT PCR POSITIVE (A) NEGATIVE    Comment: RESULT CALLED TO, READ BACK BY AND VERIFIED WITH:  Hansel Feinstein [id number] 05/31/20 SDR (NOTE) SARS-CoV-2 target nucleic acids are DETECTED.  SARS-CoV-2 RNA is generally detectable in upper respiratory specimens  during the acute phase of infection. Positive results are indicative of the presence of the identified virus, but do not rule out bacterial infection or co-infection with other pathogens not detected by the test. Clinical correlation with patient history and other diagnostic information is necessary to determine patient infection status. The expected result is Negative.  Fact Sheet for Patients:  https://www.moore.com/  Fact Sheet for Healthcare Providers: https://www.young.biz/  This test is not yet approved or cleared by the [place] FDA and  has been authorized for detection and/or diagnosis of SARS-CoV-2 by FDA under an Emergency Use Authorization (EUA).  This EUA will remain in effect (meaning this test can be use d) for the duration of  the COVID-19 declaration under Section 564(b)(1) of the Act, 21 U.S.C. section 360bbb-3(b)(1), unless the authorization is terminated or revoked sooner.      Influenza A by PCR NEGATIVE NEGATIVE   Influenza B by PCR NEGATIVE NEGATIVE    Comment: (NOTE) The Xpert Xpress SARS-CoV-2/FLU/RSV assay is intended as an aid in  the diagnosis of influenza from Nasopharyngeal swab specimens and  should not be used as a sole basis  for treatment. Nasal washings and  aspirates are unacceptable for Xpert Xpress SARS-CoV-2/FLU/RSV  testing.  Fact Sheet for Patients: https://www.moore.com/https://www.fda.gov/media/142436/download  Fact Sheet for Healthcare Providers: https://www.young.biz/https://www.fda.gov/media/142435/download  This test is not yet approved or cleared by the [place] FDA and  has been authorized for detection and/or diagnosis of SARS-CoV-2 by  FDA under an Emergency Use Authorization (EUA). This EUA will remain  in effect (meaning this test can be used) for the duration of the  Covid-19 declaration under Section 564(b)(1) of the Act, 21  U.S.C. section 360bbb-3(b)(1), unless the authorization is  terminated or revoked. Performed at [facility], [address] [place], [place] [address]     No current facility-administered medications for this encounter.   Current Outpatient Medications  Medication Sig Dispense Refill  . OLANZapine (ZYPREXA) 5 MG tablet Take 1 tablet (5 mg total) by mouth at bedtime. 30 tablet 2    Musculoskeletal: Strength & Muscle Tone: normal  Gait & Station: normal  Patient leans: na  Psychiatric Specialty Exam: Physical Exam  Review of Systems  Blood pressure 110/70, pulse 70, temperature 99.2 F (37.3 C), temperature source Oral, resp. rate 18, height 5\' 4"  (1.626 m), weight 65.8 kg, last menstrual period 05/30/2020, SpO2 99 %.Body mass index is 24.89 kg/m.  Mental Status   Depressed somewhat unkept Cooperative oriented times four Rapport and eye contact okay No shakes tics tremors Consciousness normal  Concentration and attention okay Mood and affect --has depression and anxiety  Thought process and content --depressive and victim issues No frank psychosis or mania Memory remote recent immediate okay  Judgement insight fair to poor Reliability okay Abstraction okay  Speech normal rate tone volume fluency  SI and HI --not clear  Vague ideation currently okay                                                        Handedness not known Recall okay  Language normal Akathisia none Sleep on and off Assets  Seeks help  ADL's--slightly down  Cognition okay Psychomotor normal       Treatment Plan Summary:  Caucasian female with dual diagnosis ETOH withdrawal  And also COVID positive Placed on Psych meds and also CIWA protocol  Agrees to stay but also on IVC    ESL 3-5 days   Disposition:  Remains here pending above   Roselind Messieramakrishna Mcarthur Ivins, MD 05/31/2020 2:02 PM

## 2020-05-31 NOTE — ED Notes (Signed)
Pt states she has no need to urinate at this time, but will collect sample with next void

## 2020-06-01 ENCOUNTER — Telehealth: Payer: Self-pay | Admitting: Nurse Practitioner

## 2020-06-01 DIAGNOSIS — F1494 Cocaine use, unspecified with cocaine-induced mood disorder: Secondary | ICD-10-CM

## 2020-06-01 LAB — URINE DRUG SCREEN, QUALITATIVE (ARMC ONLY)
Amphetamines, Ur Screen: NOT DETECTED
Barbiturates, Ur Screen: NOT DETECTED
Benzodiazepine, Ur Scrn: POSITIVE — AB
Cannabinoid 50 Ng, Ur ~~LOC~~: NOT DETECTED
Cocaine Metabolite,Ur ~~LOC~~: POSITIVE — AB
MDMA (Ecstasy)Ur Screen: NOT DETECTED
Methadone Scn, Ur: NOT DETECTED
Opiate, Ur Screen: NOT DETECTED
Phencyclidine (PCP) Ur S: NOT DETECTED
Tricyclic, Ur Screen: NOT DETECTED

## 2020-06-01 LAB — PREGNANCY, URINE: Preg Test, Ur: NEGATIVE

## 2020-06-01 LAB — URINALYSIS, COMPLETE (UACMP) WITH MICROSCOPIC
RBC / HPF: >50 RBC/hpf — ABNORMAL HIGH (ref 0–5)
Specific Gravity, Urine: 1.016 (ref 1.005–1.030)
Squamous Epithelial / HPF: NONE SEEN (ref 0–5)
WBC, UA: >50 WBC/hpf — ABNORMAL HIGH (ref 0–5)

## 2020-06-01 MED ORDER — OLANZAPINE 5 MG PO TABS
5.0000 mg | ORAL_TABLET | Freq: Every day | ORAL | Status: DC
  Administered 2020-06-01 – 2020-06-02 (×2): 5 mg via ORAL
  Filled 2020-06-01 (×2): qty 1

## 2020-06-01 MED ORDER — SODIUM CHLORIDE 0.9 % IV SOLN
1200.0000 mg | Freq: Once | INTRAVENOUS | Status: AC
  Administered 2020-06-01: 1200 mg via INTRAVENOUS
  Filled 2020-06-01: qty 10

## 2020-06-01 MED ORDER — SODIUM CHLORIDE 0.9 % IV SOLN: INTRAVENOUS | Status: DC | PRN

## 2020-06-01 MED ORDER — DIPHENHYDRAMINE HCL 50 MG/ML IJ SOLN: 50.0000 mg | Freq: Once | INTRAMUSCULAR | Status: DC | PRN

## 2020-06-01 MED ORDER — LORAZEPAM 1 MG PO TABS
1.0000 mg | ORAL_TABLET | Freq: Four times a day (QID) | ORAL | Status: DC | PRN
  Administered 2020-06-01: 1 mg via ORAL
  Filled 2020-06-01: qty 1

## 2020-06-01 MED ORDER — EPINEPHRINE 0.3 MG/0.3ML IJ SOAJ
0.3000 mg | Freq: Once | INTRAMUSCULAR | Status: DC | PRN
  Filled 2020-06-01: qty 0.3

## 2020-06-01 MED ORDER — ALBUTEROL SULFATE HFA 108 (90 BASE) MCG/ACT IN AERS
2.0000 | INHALATION_SPRAY | Freq: Once | RESPIRATORY_TRACT | Status: DC | PRN
  Filled 2020-06-01: qty 6.7

## 2020-06-01 MED ORDER — METHYLPREDNISOLONE SODIUM SUCC 125 MG IJ SOLR
125.0000 mg | Freq: Once | INTRAMUSCULAR | Status: DC | PRN
  Filled 2020-06-01: qty 2

## 2020-06-01 MED ORDER — FAMOTIDINE IN NACL 20-0.9 MG/50ML-% IV SOLN
20.0000 mg | Freq: Once | INTRAVENOUS | Status: DC | PRN
  Filled 2020-06-01: qty 50

## 2020-06-01 NOTE — ED Provider Notes (Signed)
Emergency Medicine Observation Re-evaluation Note  Carly Drake is a 33 y.o. female, seen on rounds today.    Physical Exam  BP 104/60   Pulse 64   Temp 98.6 F (37 C)   Resp 18   Ht 5\' 4"  (1.626 m)   Wt 65.8 kg   LMP 05/30/2020   SpO2 97%   BMI 24.89 kg/m  Physical Exam General: Patient resting comfortably Lungs: Patient in no respiratory distress Psych: Patient calm, not combative  ED Course / MDM  EKG:EKG Interpretation  Date/Time:  Thursday May 30 2020 18:35:02 EDT Ventricular Rate:  58 PR Interval:  142 QRS Duration: 74 QT Interval:  380 QTC Calculation: 373 R Axis:   57 Text Interpretation: Sinus bradycardia Cannot rule out Anterior infarct , age undetermined Abnormal ECG ----------unconfirmed---------- Confirmed by UNCONFIRMED, DOCTOR (02-26-1991), editor [id number] [phone]) on 05/31/2020 11:20:03 AM    I have reviewed the labs performed to date  Plan  Patient is Covid positive.  We will continue mellitus monitor her vital signs carefully especially her pulse ox.  She is not currently hypoxic.  Disposition per psychiatry   07/31/2020, MD 06/01/20 1050

## 2020-06-01 NOTE — ED Notes (Signed)
Hourly rounding reveals patient resting in bed in room 22 for post-infusion observation. No complaints, stable, in no acute distress. Q15 minute rounds and monitoring via Psychologist, counselling to continue.

## 2020-06-01 NOTE — ED Notes (Signed)
Hourly rounding reveals patient asleep in room. No complaints, stable, in no acute distress. Q15 minute rounds and monitoring via Security Cameras to continue. 

## 2020-06-01 NOTE — ED Notes (Signed)
Pt in wheelchair with security back to [facility] after completion of mab infusion with no adverse effects noted or reported.

## 2020-06-01 NOTE — Progress Notes (Signed)
Pharmacy COVID-19 Monoclonal Antibody Screening  Carly Drake was identified as being not hospitalized with symptoms from Covid-19 on admission but an incidental positive PCR has been documented.  The patient may qualify for the use of monoclonal antibodies (mAB) for COVID-19 viral infection to prevent worsening symptoms stemming from Covid-19 infection.  The patient was identified based on a positive COVID-19 PCR and not requiring the use of supplemental oxygen at this time.  This patient meets the FDA criteria for Emergency Use Authorization of casirivimab/imdevimab or bamlanivimab/etesevimab.  Has a (+) direct SARS-CoV-2 viral test result  Is NOT hospitalized due to COVID-19  Is within 10 days of symptom onset  Has at least one of the high risk factor(s) for progression to severe COVID-19 and/or hospitalization as defined in EUA.  Specific high risk criteria : Other high risk medical condition per CDC:  Substance use   Additionally: The patient has had a positive COVID-19 PCR in the last 90 days.  The patient is unvaccinated against COVID-19.  Since the patient is unvaccinated and meets high risk criteria, the patient is eligible for mAB administration.   This eligibility and indication for treatment was discussed with the patient's physician: Dr. Darnelle Catalan.   Plan: Based on the above discussion, it was decided that the patient will receive one dose of the available COVID-19 mAB combination. Pharmacy will coordinate administration timing with patient's nurse. Recommended infusion monitoring parameters communicated to the nursing team.   Gardner Candle, PharmD, BCPS Clinical Pharmacist 06/01/2020 12:00 PM

## 2020-06-01 NOTE — ED Notes (Signed)
Hourly rounding reveals patient resting in bed with infusion running. No complaints, stable, in no acute distress. Q15 minute rounds and monitoring via Psychologist, counselling to continue.  Blanket and sprite provided per pt request.

## 2020-06-01 NOTE — Telephone Encounter (Signed)
Patient currently meets criteria for MAB due to SVI. She is currently IVC in the [facility] ED. Spoke with Dr. Darnelle Catalan who graciously agreed to try and get her MAB infusion in the ED while she is there. Provided post-COVID Care phone number for the patient for after discharge.

## 2020-06-01 NOTE — ED Notes (Signed)
Pt moved to [facility] to receive infusion.

## 2020-06-01 NOTE — ED Provider Notes (Signed)
MAB infusion groups text me to let me know that patient is a candidate for MAB infusion due to social vulnerability.  I obtain the fax sheet from pharmacy and go to discuss it with her.  Patient does not want to read the fax sheet I go over it with her reading parts of it to her she understands that symptoms could get worse there could be a reaction in it it is a new medicine given emergency approval by the FDA.  This means he could have unknown effects.  She wants to go ahead and do the infusion.  We will begin to do this.   Arnaldo Natal, MD 06/01/20 1258

## 2020-06-01 NOTE — Consult Note (Signed)
[facility] Face-to-Face Psychiatry Consult   Reason for Consult:  Suicidal ideations Referring Physician:  EDP Patient Identification: Carly Drake MRN:  [id number] Principal Diagnosis: Cocaine-induced mood disorder (HCC) Diagnosis:  Principal Problem:   Cocaine-induced mood disorder (HCC) Active Problems:   Cocaine use disorder, moderate, dependence (HCC)   Total Time spent with patient: 45 minutes  Subjective:   Carly Drake is a 34 y.o. female patient admitted with suicidal ideations.  Patient seen and evaluated in person by this provider.  She was calmly resting on her bed.  Denies suicidal/homicidal ideations, hallucinations, and withdrawal symptoms.  She does feel fatigued with muscle weakness r/t COVID.  Currently receiving IV COVID medications.  Will continue to evaluate her mood for 24 hours to determine final disposition.  HPI per TTS:  Carly Drake is an 34 y.o. female who presents to the ER due to not feeling well physically. She also reports, she has had increased symptoms of depression for the last several months. In the past when she has felt like this, it resulted in her getting a knife an attempting to cut her wrist, with the intentions of ending her life. However, with this ER visit, she was trying to be proactive, so it would not lead to her cutting herself. Patient further reports, the thoughts of dying are increasing and have become more intense. Current stressors are; unemployment, currently homeless, limited resources and no support system. Patient has used alcohol and cocaine as a means to cope with the depression and stressors.  During the interview, the patient was calm cooperative and pleasant. She was able to provide appropriate answers to the questions. She reports of having no HI and AV/H and no history of violence or aggression. She's currently involved with the court system and her upcoming court date was continued by her attorney. Charges were drug related.  Past  Psychiatric History: substance use d/o, depression, anxiety  Risk to Self: None Risk to Others: Homicidal Ideation: No Thoughts of Harm to Others: No Current Homicidal Intent: No Current Homicidal Plan: No Access to Homicidal Means: No Identified Victim: Reports of none History of harm to others?: No Assessment of Violence: None Noted Violent Behavior Description: Reports of none Does patient have access to weapons?: No Criminal Charges Pending?: No Does patient have a court date: No Prior Inpatient Therapy: Prior Inpatient Therapy: Yes Prior Therapy Dates: 02/2016, 01/2016 & 05/2013 Prior Therapy Facilty/Provider(s): [facility], [facility] & [facility] Reason for Treatment: Depression and Substance Abuse Disorder Prior Outpatient Therapy: Prior Outpatient Therapy: No Does patient have Intensive In-House Services?  : No Does patient have [facility] services? : No Does patient have P4CC services?: No  Past Medical History:  Past Medical History:  Diagnosis Date  . Anxiety   . Bipolar disorder (HCC)   . Depression   . Heart murmur since birth  . Paranoid schizophrenia (HCC)   . Pilonidal cyst     Past Surgical History:  Procedure Laterality Date  . CESAREAN SECTION  2007   1x   Family History:  Family History  Problem Relation Age of Onset  . Alcohol abuse Mother   . Drug abuse Mother   . Alcohol abuse Father    Family Psychiatric  History: see above Social History:  Social History   Substance and Sexual Activity  Alcohol Use Yes   Comment: daily until intoxicated     Social History   Substance and Sexual Activity  Drug Use Yes  . Types: Cocaine, "Crack"  cocaine   Comment: last used 01/27/19    Social History   Socioeconomic History  . Marital status: Married    Spouse name: Not on file  . Number of children: Not on file  . Years of education: Not on file  . Highest education level: Not on file  Occupational History  . Not on file  Tobacco Use  .  Smoking status: Current Every Day Smoker    Packs/day: 1.00    Years: 19.00    Pack years: 19.00    Types: Cigarettes  . Smokeless tobacco: Never Used  Vaping Use  . Vaping Use: Never used  Substance and Sexual Activity  . Alcohol use: Yes    Comment: daily until intoxicated  . Drug use: Yes    Types: Cocaine, "Crack" cocaine    Comment: last used 01/27/19  . Sexual activity: Yes    Birth control/protection: None  Other Topics Concern  . Not on file  Social History Narrative  . Not on file   Social Determinants of Health   Financial Resource Strain:   . Difficulty of Paying Living Expenses: Not on file  Food Insecurity:   . Worried About Programme researcher, broadcasting/film/video in the Last Year: Not on file  . Ran Out of Food in the Last Year: Not on file  Transportation Needs:   . Lack of Transportation (Medical): Not on file  . Lack of Transportation (Non-Medical): Not on file  Physical Activity:   . Days of Exercise per Week: Not on file  . Minutes of Exercise per Session: Not on file  Stress:   . Feeling of Stress : Not on file  Social Connections:   . Frequency of Communication with Friends and Family: Not on file  . Frequency of Social Gatherings with Friends and Family: Not on file  . Attends Religious Services: Not on file  . Active Member of Clubs or Organizations: Not on file  . Attends Banker Meetings: Not on file  . Marital Status: Not on file   Additional Social History:    Allergies:   Allergies  Allergen Reactions  . Clindamycin/Lincomycin Nausea And Vomiting  . Other Nausea And Vomiting    NO ONIONS OR PEPPERS  Unknown antibiotic    Labs:  Results for orders placed or performed during the hospital encounter of 05/31/20 (from the past 48 hour(s))  Basic metabolic panel     Status: Abnormal   Collection Time: 05/30/20  6:34 PM  Result Value Ref Range   Sodium 138 135 - 145 mmol/L   Potassium 3.6 3.5 - 5.1 mmol/L   Chloride 101 98 - 111 mmol/L    CO2 29 22 - 32 mmol/L   Glucose, Bld 99 70 - 99 mg/dL    Comment: Glucose reference range applies only to samples taken after fasting for at least 8 hours.   BUN 12 6 - 20 mg/dL   Creatinine, Ser 6.38 (H) 0.44 - 1.00 mg/dL   Calcium 8.2 (L) 8.9 - 10.3 mg/dL   GFR calc non Af Amer >60 >60 mL/min   GFR calc Af Amer >60 >60 mL/min   Anion gap 8 5 - 15    Comment: Performed at [facility], [address] [place], [place] [address]  CBC     Status: Abnormal   Collection Time: 05/30/20  6:34 PM  Result Value Ref Range   WBC 3.5 (L) 4.0 - 10.5 K/uL   RBC 4.43 3.87 -  5.11 MIL/uL   Hemoglobin 13.4 12.0 - 15.0 g/dL   HCT 79.3 36 - 46 %   MCV 88.5 80.0 - 100.0 fL   MCH 30.2 26.0 - 34.0 pg   MCHC 34.2 30.0 - 36.0 g/dL   RDW 90.3 00.9 - 23.3 %   Platelets 206 150 - 400 K/uL   nRBC 0.0 0.0 - 0.2 %    Comment: Performed at [facility], [address], [place], [place] [address]  Troponin I (High Sensitivity)     Status: None   Collection Time: 05/30/20  6:34 PM  Result Value Ref Range   Troponin I (High Sensitivity) 3 <18 ng/L    Comment: (NOTE) Elevated high sensitivity troponin I (hsTnI) values and significant  changes across serial measurements may suggest ACS but many other  chronic and acute conditions are known to elevate hsTnI results.  Refer to the "Links" section for chest pain algorithms and additional  guidance. Performed at [facility], [address] [place], [place] [address]   Troponin I (High Sensitivity)     Status: None   Collection Time: 05/30/20  8:34 PM  Result Value Ref Range   Troponin I (High Sensitivity) 3 <18 ng/L    Comment: (NOTE) Elevated high sensitivity troponin I (hsTnI) values and significant  changes across serial measurements may suggest ACS but many other  chronic and acute conditions are known to elevate hsTnI results.  Refer to the "Links" section for chest pain algorithms and additional  guidance. Performed at  [facility], [address] [place], [place] [address]   Respiratory Panel by RT PCR (Flu A&B, Covid) - Nasopharyngeal Swab     Status: Abnormal   Collection Time: 05/31/20  4:44 AM   Specimen: Nasopharyngeal Swab  Result Value Ref Range   SARS Coronavirus 2 by RT PCR POSITIVE (A) NEGATIVE    Comment: RESULT CALLED TO, READ BACK BY AND VERIFIED WITH:  Hansel Feinstein [id number] 05/31/20 SDR (NOTE) SARS-CoV-2 target nucleic acids are DETECTED.  SARS-CoV-2 RNA is generally detectable in upper respiratory specimens  during the acute phase of infection. Positive results are indicative of the presence of the identified virus, but do not rule out bacterial infection or co-infection with other pathogens not detected by the test. Clinical correlation with patient history and other diagnostic information is necessary to determine patient infection status. The expected result is Negative.  Fact Sheet for Patients:  https://www.moore.com/  Fact Sheet for Healthcare Providers: https://www.young.biz/  This test is not yet approved or cleared by the [place] FDA and  has been authorized for detection and/or diagnosis of SARS-CoV-2 by FDA under an Emergency Use Authorization (EUA).  This EUA will remain in effect (meaning this test can be use d) for the duration of  the COVID-19 declaration under Section 564(b)(1) of the Act, 21 U.S.C. section 360bbb-3(b)(1), unless the authorization is terminated or revoked sooner.      Influenza A by PCR NEGATIVE NEGATIVE   Influenza B by PCR NEGATIVE NEGATIVE    Comment: (NOTE) The Xpert Xpress SARS-CoV-2/FLU/RSV assay is intended as an aid in  the diagnosis of influenza from Nasopharyngeal swab specimens and  should not be used as a sole basis for treatment. Nasal washings and  aspirates are unacceptable for Xpert Xpress SARS-CoV-2/FLU/RSV  testing.  Fact Sheet for  Patients: https://www.moore.com/  Fact Sheet for Healthcare Providers: https://www.young.biz/  This test is not yet approved or cleared by the [place] and  has been authorized for detection and/or diagnosis of SARS-CoV-2 by  FDA under an Emergency Use Authorization (EUA). This EUA will remain  in effect (meaning this test can be used) for the duration of the  Covid-19 declaration under Section 564(b)(1) of the Act, 21  U.S.C. section 360bbb-3(b)(1), unless the authorization is  terminated or revoked. Performed at [facility], [address] [place], [place] [address]   Urine Drug Screen, Qualitative     Status: Abnormal   Collection Time: 05/31/20  9:57 AM  Result Value Ref Range   Tricyclic, Ur Screen NONE DETECTED NONE DETECTED   Amphetamines, Ur Screen NONE DETECTED NONE DETECTED   MDMA (Ecstasy)Ur Screen NONE DETECTED NONE DETECTED   Cocaine Metabolite,Ur [place] POSITIVE (A) NONE DETECTED   Opiate, Ur Screen NONE DETECTED NONE DETECTED   Phencyclidine (PCP) Ur S NONE DETECTED NONE DETECTED   Cannabinoid 50 Ng, Ur [place] NONE DETECTED NONE DETECTED   Barbiturates, Ur Screen NONE DETECTED NONE DETECTED   Benzodiazepine, Ur Scrn POSITIVE (A) NONE DETECTED   Methadone Scn, Ur NONE DETECTED NONE DETECTED    Comment: (NOTE) Tricyclics + metabolites, urine    Cutoff 1000 ng/mL Amphetamines + metabolites, urine  Cutoff 1000 ng/mL MDMA (Ecstasy), urine              Cutoff 500 ng/mL Cocaine Metabolite, urine          Cutoff 300 ng/mL Opiate + metabolites, urine        Cutoff 300 ng/mL Phencyclidine (PCP), urine         Cutoff 25 ng/mL Cannabinoid, urine                 Cutoff 50 ng/mL Barbiturates + metabolites, urine  Cutoff 200 ng/mL Benzodiazepine, urine              Cutoff 200 ng/mL Methadone, urine                   Cutoff 300 ng/mL  The urine drug screen provides only a preliminary, unconfirmed analytical test result and  should not be used for non-medical purposes. Clinical consideration and professional judgment should be applied to any positive drug screen result due to possible interfering substances. A more specific alternate chemical method must be used in order to obtain a confirmed analytical result. Gas chromatography / mass spectrometry (GC/MS) is the preferred confirm atory method. Performed at [facility], [address] [place], [place] [address]   Urinalysis, Complete w Microscopic Urine, Clean Catch     Status: Abnormal   Collection Time: 05/31/20  9:57 AM  Result Value Ref Range   Color, Urine BROWN (A) YELLOW   APPearance TURBID (A) CLEAR   Specific Gravity, Urine 1.016 1.005 - 1.030   pH  5.0 - 8.0    TEST NOT REPORTED DUE TO COLOR INTERFERENCE OF URINE PIGMENT   Glucose, UA (A) NEGATIVE mg/dL    TEST NOT REPORTED DUE TO COLOR INTERFERENCE OF URINE PIGMENT   Hgb urine dipstick (A) NEGATIVE    TEST NOT REPORTED DUE TO COLOR INTERFERENCE OF URINE PIGMENT   Bilirubin Urine (A) NEGATIVE    TEST NOT REPORTED DUE TO COLOR INTERFERENCE OF URINE PIGMENT   Ketones, ur (A) NEGATIVE mg/dL    TEST NOT REPORTED DUE TO COLOR INTERFERENCE OF URINE PIGMENT   Protein, ur (A) NEGATIVE mg/dL    TEST NOT REPORTED DUE TO COLOR INTERFERENCE OF URINE PIGMENT   Nitrite (A) NEGATIVE  TEST NOT REPORTED DUE TO COLOR INTERFERENCE OF URINE PIGMENT   Leukocytes,Ua (A) NEGATIVE    TEST NOT REPORTED DUE TO COLOR INTERFERENCE OF URINE PIGMENT   RBC / HPF >50 (H) 0 - 5 RBC/hpf   WBC, UA >50 (H) 0 - 5 WBC/hpf   Bacteria, UA RARE (A) NONE SEEN   Squamous Epithelial / LPF NONE SEEN 0 - 5   Mucus PRESENT    Amorphous [place] PRESENT     Comment: Performed at [facility], [address], [place], [place] [address]  Acetaminophen level     Status: Abnormal   Collection Time: 05/31/20  8:43 PM  Result Value Ref Range   Acetaminophen (Tylenol), Serum <10 (L) 10 - 30 ug/mL    Comment:  (NOTE) Therapeutic concentrations vary significantly. A range of 10-30 ug/mL  may be an effective concentration for many patients. However, some  are best treated at concentrations outside of this range. Acetaminophen concentrations >150 ug/mL at 4 hours after ingestion  and >50 ug/mL at 12 hours after ingestion are often associated with  toxic reactions.  Performed at [facility], [address] [place], [place] [address]   Ethanol     Status: None   Collection Time: 05/31/20  8:43 PM  Result Value Ref Range   Alcohol, Ethyl (B) <10 <10 mg/dL    Comment: (NOTE) Lowest detectable limit for serum alcohol is 10 mg/dL.  For medical purposes only. Performed at [facility], [address] [place], [place] [address]   Salicylate level     Status: Abnormal   Collection Time: 05/31/20  8:43 PM  Result Value Ref Range   Salicylate Lvl <7.0 (L) 7.0 - 30.0 mg/dL    Comment: Performed at [facility], [address] [place], [place] [address]  Pregnancy, urine     Status: None   Collection Time: 06/01/20  9:57 AM  Result Value Ref Range   Preg Test, Ur NEGATIVE NEGATIVE    Comment: Performed at [facility], [address] [place], [place] [address]    Current Facility-Administered Medications  Medication Dose Route Frequency Provider Last Rate Last Admin  . 0.9 %  sodium chloride infusion   Intravenous PRN Arnaldo Natal, MD      . albuterol (VENTOLIN HFA) 108 (90 Base) MCG/ACT inhaler 2 puff  2 puff Inhalation Once PRN Arnaldo Natal, MD      . casirivimab-imdevimab (REGEN-COV) 1,200 mg in sodium chloride 0.9 % 110 mL IVPB  1,200 mg Intravenous Once Arnaldo Natal, MD      . diphenhydrAMINE (BENADRYL) injection 50 mg  50 mg Intravenous Once PRN Arnaldo Natal, MD      . EPINEPHrine (EPI-PEN) injection 0.3 mg  0.3 mg Intramuscular Once PRN Arnaldo Natal, MD      . famotidine (PEPCID) IVPB 20 mg premix  20 mg Intravenous Once PRN  Arnaldo Natal, MD      . LORazepam (ATIVAN) tablet 1 mg  1 mg Oral Q6H PRN Charm Rings, NP      . methylPREDNISolone sodium succinate (SOLU-MEDROL) 125 mg/2 mL injection 125 mg  125 mg Intravenous Once PRN Arnaldo Natal, MD      . OLANZapine (ZYPREXA) tablet 10 mg  10 mg Oral QHS Roselind Messier, MD   10 mg at 05/31/20 2127  . sertraline (ZOLOFT) tablet 50 mg  50 mg Oral Daily Roselind Messier, MD   50 mg at  06/01/20 0959  . thiamine tablet 100 mg  100 mg Oral Daily Willy Eddyobinson, Patrick, MD   100 mg at 06/01/20 [id number]   Or  . thiamine (B-1) injection 100 mg  100 mg Intravenous Daily Willy Eddyobinson, Patrick, MD       No current outpatient medications on file.    Musculoskeletal: Strength & Muscle Tone: within normal limits Gait & Station: normal Patient leans: N/A  Psychiatric Specialty Exam: Physical Exam Vitals and nursing note reviewed.  Constitutional:      Appearance: She is well-developed.  HENT:     Head: Normocephalic.  Pulmonary:     Effort: Pulmonary effort is normal.  Musculoskeletal:        General: Normal range of motion.     Cervical back: Normal range of motion.  Neurological:     General: No focal deficit present.     Mental Status: She is alert and oriented to person, place, and time.  Psychiatric:        Attention and Perception: Attention and perception normal.        Mood and Affect: Mood is anxious and depressed.        Speech: Speech normal.        Behavior: Behavior normal. Behavior is cooperative.        Thought Content: Thought content normal.        Cognition and Memory: Cognition and memory normal.        Judgment: Judgment normal.     Review of Systems  Constitutional: Positive for fatigue.  Musculoskeletal: Positive for myalgias.  Psychiatric/Behavioral: Positive for dysphoric mood. The patient is nervous/anxious.   All other systems reviewed and are negative.   Blood pressure 104/60, pulse 64, temperature 98.6 F (37 C), resp. rate 18,  height 5\' 4"  (1.626 m), weight 65.8 kg, last menstrual period 05/30/2020, SpO2 97 %.Body mass index is 24.89 kg/m.  General Appearance: Casual  Eye Contact:  Good  Speech:  Normal Rate  Volume:  Normal  Mood:  Anxious and Depressed  Affect:  Congruent  Thought Process:  Coherent and Descriptions of Associations: Intact  Orientation:  Full (Time, Place, and Person)  Thought Content:  WDL and Logical  Suicidal Thoughts:  No  Homicidal Thoughts:  No  Memory:  Immediate;   Good Recent;   Good Remote;   Good  Judgement:  Fair  Insight:  Fair  Psychomotor Activity:  Decreased  Concentration:  Concentration: Fair and Attention Span: Fair  Recall:  Good  Fund of Knowledge:  Good  Language:  Good  Akathisia:  No  Handed:  Right  AIMS (if indicated):     Assets:  Communication Skills Leisure Time Resilience  ADL's:  Intact  Cognition:  WNL  Sleep:        Treatment Plan Summary: Daily contact with patient to assess and evaluate symptoms and progress in treatment, Medication management and Plan cocaine induced mood disorder:  -Continue Zoloft 50 mg daily -Decrease Zyprexa 10 mg daily at night to 5 mg  -Re-evaluate tomorrow for discharge.  Disposition: Supportive therapy provided about ongoing stressors.  Nanine MeansJamison Leva Baine, NP 06/01/2020 1:29 PM

## 2020-06-02 MED ORDER — ACETAMINOPHEN 325 MG PO TABS
650.0000 mg | ORAL_TABLET | Freq: Once | ORAL | Status: AC
  Administered 2020-06-02: 650 mg via ORAL
  Filled 2020-06-02: qty 2

## 2020-06-02 NOTE — ED Notes (Signed)
Pt asked this RN about plan of discharge- this RN informed her that she was told by NP Asher Muir that the pt would be able to go home either tonight or tomorrow- pt asked about a taxi voucher to get her home as she was brought in by ambulance and did not have anyone she could call to get her- informed the pt that when her discharge was ready the charge nurse would have to approve the taxi voucher

## 2020-06-02 NOTE — ED Notes (Signed)
Hourly rounding reveals patient in room. No complaints, stable, in no acute distress. Q15 minute rounds and monitoring via Security Cameras to continue. 

## 2020-06-02 NOTE — Discharge Instructions (Addendum)
[facility] (Mental Health & Substance Use Services) & [facility]  Mental health service in [place] Address: [address], [place], [place] [address] Hours:  Closed ? Opens 8AM Mon Phone: [phone] 

## 2020-06-02 NOTE — ED Provider Notes (Signed)
Emergency Medicine Observation Re-evaluation Note  Carly Drake is a 34 y.o. female, seen on rounds today.    Physical Exam  BP (!) 108/50 (BP Location: Right Arm)   Pulse 68   Temp 98.4 F (36.9 C) (Oral)   Resp 14   Ht 5\' 4"  (1.626 m)   Wt 65.8 kg   LMP 05/30/2020   SpO2 97%   BMI 24.89 kg/m  Physical Exam General: Patient resting comfortably. Lungs patient in no respiratory distress Psych: Patient calm not combative  ED Course / MDM  EKG:EKG Interpretation  Date/Time:  Thursday May 30 2020 18:35:02 EDT Ventricular Rate:  58 PR Interval:  142 QRS Duration: 74 QT Interval:  380 QTC Calculation: 373 R Axis:   57 Text Interpretation: Sinus bradycardia Cannot rule out Anterior infarct , age undetermined Abnormal ECG ----------unconfirmed---------- Confirmed by UNCONFIRMED, DOCTOR (02-26-1991), editor [id number] [phone]) on 05/31/2020 11:20:03 AM    I have reviewed the labs performed to date as well as medications administered while in observation.  No recent changes in the last 24 hours  Plan  Current plan is for disposition per psychiatry and social work pending quarantine for coronavirus infection. Patient is under full IVC at this time.   07/31/2020, MD 06/02/20 1041

## 2020-06-02 NOTE — Consult Note (Signed)
[facility] Face-to-Face Psychiatry Consult   Reason for Consult:  Suicidal ideations Referring Physician:  EDP Patient Identification: Carly LoboCrystal Drake MRN:  [id number] Principal Diagnosis: Cocaine-induced mood disorder (HCC) Diagnosis:  Principal Problem:   Cocaine-induced mood disorder (HCC) Active Problems:   Cocaine use disorder, moderate, dependence (HCC)   Total Time spent with patient: 45 minutes  Subjective:   Carly Drake is a 34 y.o. female patient admitted with suicidal ideations.  Patient seen and evaluated in person by this provider.  She continues to have no suicidal/homicidal ideations, hallucinations, or withdrawal symptoms yet felt she needed one more day to avoid the negative people who triggered her suicidal ideations prior to admission.  Discussed follow up with [facility] for therapy to assist with her coping in difficult situations.  Planning her discharge tomorrow for the am, [facility] resources in discharge instructions.    10/2: Patient seen and evaluated in person by this provider.  She was calmly resting on her bed.  Denies suicidal/homicidal ideations, hallucinations, and withdrawal symptoms.  She does feel fatigued with muscle weakness r/t COVID.  Currently receiving IV COVID medications.  Will continue to evaluate her mood for 24 hours to determine final disposition.  HPI per TTS:  Carly LoboCrystal Drake is an 34 y.o. female who presents to the ER due to not feeling well physically. She also reports, she has had increased symptoms of depression for the last several months. In the past when she has felt like this, it resulted in her getting a knife an attempting to cut her wrist, with the intentions of ending her life. However, with this ER visit, she was trying to be proactive, so it would not lead to her cutting herself. Patient further reports, the thoughts of dying are increasing and have become more intense. Current stressors are; unemployment, currently homeless, limited resources and no  support system. Patient has used alcohol and cocaine as a means to cope with the depression and stressors.  During the interview, the patient was calm cooperative and pleasant. She was able to provide appropriate answers to the questions. She reports of having no HI and AV/H and no history of violence or aggression. She's currently involved with the court system and her upcoming court date was continued by her attorney. Charges were drug related.  Past Psychiatric History: substance use d/o, depression, anxiety  Risk to Self: None Risk to Others: Homicidal Ideation: No Thoughts of Harm to Others: No Current Homicidal Intent: No Current Homicidal Plan: No Access to Homicidal Means: No Identified Victim: Reports of none History of harm to others?: No Assessment of Violence: None Noted Violent Behavior Description: Reports of none Does patient have access to weapons?: No Criminal Charges Pending?: No Does patient have a court date: No Prior Inpatient Therapy: Prior Inpatient Therapy: Yes Prior Therapy Dates: 02/2016, 01/2016 & 05/2013 Prior Therapy Facilty/Provider([facility] Reason for Treatment: Depression and Substance Abuse Disorder Prior Outpatient Therapy: Prior Outpatient Therapy: No Does patient have Intensive In-House Services?  : No Does patient have [facility] services? : No Does patient have P4CC services?: No  Past Medical History:  Past Medical History:  Diagnosis Date  . Anxiety   . Bipolar disorder (HCC)   . Depression   . Heart murmur since birth  . Paranoid schizophrenia (HCC)   . Pilonidal cyst     Past Surgical History:  Procedure Laterality Date  . CESAREAN SECTION  2007   1x   Family History:  Family History  Problem Relation Age of Onset  . Alcohol abuse Mother   . Drug abuse Mother   . Alcohol abuse Father    Family Psychiatric  History: see above Social History:  Social History   Substance and Sexual Activity   Alcohol Use Yes   Comment: daily until intoxicated     Social History   Substance and Sexual Activity  Drug Use Yes  . Types: Cocaine, "Crack" cocaine   Comment: last used 01/27/19    Social History   Socioeconomic History  . Marital status: Married    Spouse name: Not on file  . Number of children: Not on file  . Years of education: Not on file  . Highest education level: Not on file  Occupational History  . Not on file  Tobacco Use  . Smoking status: Current Every Day Smoker    Packs/day: 1.00    Years: 19.00    Pack years: 19.00    Types: Cigarettes  . Smokeless tobacco: Never Used  Vaping Use  . Vaping Use: Never used  Substance and Sexual Activity  . Alcohol use: Yes    Comment: daily until intoxicated  . Drug use: Yes    Types: Cocaine, "Crack" cocaine    Comment: last used 01/27/19  . Sexual activity: Yes    Birth control/protection: None  Other Topics Concern  . Not on file  Social History Narrative  . Not on file   Social Determinants of Health   Financial Resource Strain:   . Difficulty of Paying Living Expenses: Not on file  Food Insecurity:   . Worried About Programme researcher, broadcasting/film/video in the Last Year: Not on file  . Ran Out of Food in the Last Year: Not on file  Transportation Needs:   . Lack of Transportation (Medical): Not on file  . Lack of Transportation (Non-Medical): Not on file  Physical Activity:   . Days of Exercise per Week: Not on file  . Minutes of Exercise per Session: Not on file  Stress:   . Feeling of Stress : Not on file  Social Connections:   . Frequency of Communication with Friends and Family: Not on file  . Frequency of Social Gatherings with Friends and Family: Not on file  . Attends Religious Services: Not on file  . Active Member of Clubs or Organizations: Not on file  . Attends Banker Meetings: Not on file  . Marital Status: Not on file   Additional Social History:    Allergies:   Allergies  Allergen  Reactions  . Clindamycin/Lincomycin Nausea And Vomiting  . Other Nausea And Vomiting    NO ONIONS OR PEPPERS  Unknown antibiotic    Labs:  Results for orders placed or performed during the hospital encounter of 05/31/20 (from the past 48 hour(s))  Acetaminophen level     Status: Abnormal   Collection Time: 05/31/20  8:43 PM  Result Value Ref Range   Acetaminophen (Tylenol), Serum <10 (L) 10 - 30 ug/mL    Comment: (NOTE) Therapeutic concentrations vary significantly. A range of 10-30 ug/mL  may be an effective concentration for many patients. However, some  are best treated at concentrations outside of this range. Acetaminophen concentrations >150 ug/mL at 4 hours after ingestion  and >50 ug/mL at 12 hours after ingestion are often associated with  toxic reactions.  Performed at [facility], [address], [place], [place] [address]   Ethanol     Status: None  Collection Time: 05/31/20  8:43 PM  Result Value Ref Range   Alcohol, Ethyl (B) <10 <10 mg/dL    Comment: (NOTE) Lowest detectable limit for serum alcohol is 10 mg/dL.  For medical purposes only. Performed at [facility], [address] [place], [place] [address]   Salicylate level     Status: Abnormal   Collection Time: 05/31/20  8:43 PM  Result Value Ref Range   Salicylate Lvl <7.0 (L) 7.0 - 30.0 mg/dL    Comment: Performed at [facility], [address] [place], [place] [address]  Pregnancy, urine     Status: None   Collection Time: 06/01/20  9:57 AM  Result Value Ref Range   Preg Test, Ur NEGATIVE NEGATIVE    Comment: Performed at [facility], [address] [place], [place] [address]    Current Facility-Administered Medications  Medication Dose Route Frequency Provider Last Rate Last Admin  . 0.9 %  sodium chloride infusion   Intravenous PRN Arnaldo Natal, MD      . albuterol (VENTOLIN HFA) 108 (90 Base) MCG/ACT inhaler 2 puff  2 puff Inhalation Once PRN  Arnaldo Natal, MD      . diphenhydrAMINE (BENADRYL) injection 50 mg  50 mg Intravenous Once PRN Arnaldo Natal, MD      . EPINEPHrine (EPI-PEN) injection 0.3 mg  0.3 mg Intramuscular Once PRN Arnaldo Natal, MD      . famotidine (PEPCID) IVPB 20 mg premix  20 mg Intravenous Once PRN Arnaldo Natal, MD      . methylPREDNISolone sodium succinate (SOLU-MEDROL) 125 mg/2 mL injection 125 mg  125 mg Intravenous Once PRN Arnaldo Natal, MD      . OLANZapine (ZYPREXA) tablet 5 mg  5 mg Oral QHS Charm Rings, NP   5 mg at 06/01/20 2107  . sertraline (ZOLOFT) tablet 50 mg  50 mg Oral Daily Roselind Messier, MD   50 mg at 06/02/20 1054  . thiamine tablet 100 mg  100 mg Oral Daily Willy Eddy, MD   100 mg at 06/02/20 1054   Or  . thiamine (B-1) injection 100 mg  100 mg Intravenous Daily Willy Eddy, MD       No current outpatient medications on file.    Musculoskeletal: Strength & Muscle Tone: within normal limits Gait & Station: normal Patient leans: N/A  Psychiatric Specialty Exam: Physical Exam Vitals and nursing note reviewed.  Constitutional:      Appearance: She is well-developed.  HENT:     Head: Normocephalic.  Pulmonary:     Effort: Pulmonary effort is normal.  Musculoskeletal:        General: Normal range of motion.     Cervical back: Normal range of motion.  Neurological:     General: No focal deficit present.     Mental Status: She is alert and oriented to person, place, and time.  Psychiatric:        Attention and Perception: Attention and perception normal.        Mood and Affect: Mood is depressed. Mood is not anxious.        Speech: Speech normal.        Behavior: Behavior normal. Behavior is cooperative.        Thought Content: Thought content normal.        Cognition and Memory: Cognition and memory normal.        Judgment: Judgment normal.     Review  of Systems  Musculoskeletal: Positive for myalgias.  Psychiatric/Behavioral: Positive for  dysphoric mood.  All other systems reviewed and are negative.   Blood pressure (!) 108/50, pulse 68, temperature 98.4 F (36.9 C), temperature source Oral, resp. rate 14, height 5\' 4"  (1.626 m), weight 65.8 kg, last menstrual period 05/30/2020, SpO2 97 %.Body mass index is 24.89 kg/m.  General Appearance: Casual  Eye Contact:  Good  Speech:  Normal Rate  Volume:  Normal  Mood:  Anxious and Depressed  Affect:  Congruent  Thought Process:  Coherent and Descriptions of Associations: Intact  Orientation:  Full (Time, Place, and Person)  Thought Content:  WDL and Logical  Suicidal Thoughts:  No  Homicidal Thoughts:  No  Memory:  Immediate;   Good Recent;   Good Remote;   Good  Judgement:  Fair  Insight:  Fair  Psychomotor Activity:  Decreased  Concentration:  Concentration: Fair and Attention Span: Fair  Recall:  Good  Fund of Knowledge:  Good  Language:  Good  Akathisia:  No  Handed:  Right  AIMS (if indicated):     Assets:  Communication Skills Leisure Time Resilience  ADL's:  Intact  Cognition:  WNL  Sleep:        Treatment Plan Summary: Daily contact with patient to assess and evaluate symptoms and progress in treatment, Medication management and Plan cocaine induced mood disorder:  -Continue Zoloft 50 mg daily -Decrease Zyprexa 5 mg daily at betime -discharge in am to follow up with [facility]  Disposition: Supportive therapy provided about ongoing stressors.  06/01/2020, NP 06/02/2020 2:18 PM

## 2020-06-02 NOTE — ED Notes (Signed)
Report to include Situation, Background, Assessment, and Recommendations received from Ariel RN. Patient alert and oriented, warm and dry, in no acute distress. Patient denies SI, HI, AVH and pain. Patient made aware of Q15 minute rounds and security cameras for their safety. Patient instructed to come to me with needs or concerns.  

## 2020-06-03 MED ORDER — LORAZEPAM 2 MG/ML IJ SOLN: 0.0000 mg | Freq: Four times a day (QID) | INTRAMUSCULAR | Status: DC

## 2020-06-03 MED ORDER — ALUM & MAG HYDROXIDE-SIMETH 200-200-20 MG/5ML PO SUSP: 30.0000 mL | Freq: Four times a day (QID) | ORAL | Status: DC | PRN

## 2020-06-03 MED ORDER — THIOTHIXENE 1 MG PO CAPS: 4.0000 mg | ORAL_CAPSULE | Freq: Two times a day (BID) | ORAL | 0 refills | Status: AC

## 2020-06-03 MED ORDER — THIOTHIXENE 2 MG PO CAPS
2.0000 mg | ORAL_CAPSULE | Freq: Two times a day (BID) | ORAL | Status: DC
  Administered 2020-06-03: 2 mg via ORAL
  Filled 2020-06-03 (×2): qty 1

## 2020-06-03 MED ORDER — CITALOPRAM HYDROBROMIDE 20 MG PO TABS: 20.0000 mg | ORAL_TABLET | Freq: Every day | ORAL | 11 refills | Status: AC

## 2020-06-03 MED ORDER — TRIHEXYPHENIDYL HCL 2 MG PO TABS
1.0000 mg | ORAL_TABLET | Freq: Two times a day (BID) | ORAL | Status: DC
  Filled 2020-06-03 (×2): qty 1

## 2020-06-03 MED ORDER — TRIHEXYPHENIDYL HCL 2 MG PO TABS: 1.0000 mg | ORAL_TABLET | Freq: Two times a day (BID) | ORAL | 0 refills | Status: AC

## 2020-06-03 MED ORDER — LORAZEPAM 2 MG PO TABS
0.0000 mg | ORAL_TABLET | Freq: Four times a day (QID) | ORAL | Status: DC
  Administered 2020-06-03: 2 mg via ORAL
  Filled 2020-06-03: qty 1

## 2020-06-03 MED ORDER — LORAZEPAM 2 MG/ML IJ SOLN: 0.0000 mg | Freq: Two times a day (BID) | INTRAMUSCULAR | Status: DC

## 2020-06-03 MED ORDER — LORAZEPAM 2 MG PO TABS: 0.0000 mg | ORAL_TABLET | Freq: Two times a day (BID) | ORAL | Status: DC

## 2020-06-03 MED ORDER — CITALOPRAM HYDROBROMIDE 20 MG PO TABS
20.0000 mg | ORAL_TABLET | Freq: Every day | ORAL | Status: DC
  Administered 2020-06-03: 20 mg via ORAL
  Filled 2020-06-03: qty 1

## 2020-06-03 MED ORDER — ONDANSETRON HCL 4 MG PO TABS: 4.0000 mg | ORAL_TABLET | Freq: Three times a day (TID) | ORAL | Status: DC | PRN

## 2020-06-03 MED ORDER — IBUPROFEN 600 MG PO TABS: 600.0000 mg | ORAL_TABLET | Freq: Three times a day (TID) | ORAL | Status: DC | PRN

## 2020-06-03 NOTE — ED Notes (Signed)
Hourly rounding reveals patient in room. No complaints, stable, in no acute distress. Q15 minute rounds and monitoring via Security Cameras to continue. 

## 2020-06-03 NOTE — ED Notes (Signed)
Pt discharging home. Discharge teaching and prescriptions reviewed with pt. She verbalized understanding and signed paper discharge form. Provided with outpatient resources and shelter printout from TTS. Personal belongings given to pt and she verbalized she received all her belongings. Escorted to lobby, ambulatory and in NAD.

## 2020-06-03 NOTE — Final Progress Note (Signed)
Physician Final Progress Note  Physician Final Progress Note  Patient ID: Carly Drake  MRN:[id number] DOB/AGE:34 year old  04/15/2020  Admit date:06/02/2020 Admitting provider:No admitting provider for patient encounter. Discharge date:06/03/2020   Admission Diagnoses:S/a disorder  Generalized anxiety  Adjustment disorder  Discharge Diagnoses: Principal Problem: Adjustment disorder with mixed disturbance of emotions and conduct  Same  Consults:TTS / / Psych MD ER MD  Significant Findings/ Diagnostic Studies:{cocaine positive benzo pos  Procedures:none  Discharge Condition:{fair  Disposition:shelter with [facility] referral   Navane 4 bid with artane 1 bid Along with Celexa 20 daily #21 each   Diet:{as tolerated   Discharge Activity:not known but recommend drug rehab 1/2 way houses AA nA related programming [facility]    She has no active SI or HI  She came overnight and wanted to leave today She was advised of need for meds and regular followup She has no other new problems but uses poor judgement insight and reliability to leave. Contracts for safety   All referrals given No acute side effects or medical problems at discharge    Strange odd irresponsible leaving for cocaine craving --oriented times four  No shakes and tremors does not want to finish CIWA No frank psychosis or mania for thought process and content  Memory no acute changes  Fund of knowledge intelligence judgment insight reliability all poor Concentration and attention fair Abstraction concrete Unkept Haggard Rapport eye contact poor  Consciousness not clouded or fluctuant No frank SI or HI   Leans not known  Handedness not known  Gait and station not known  Recall poor adl's poor Akathisia none Assets not clear Language english  Aims not done Sleep on and off lives on streets For two years  Cognition poor when  intoxicated                              Follow-up Information       Burns, Shellia Cleverly, MD.  Specialty: Internal Medicine Why: As needed Contact information: [address] [place] [address] [phone]                 Total time spent taking care of this patient:at [place]  Signed: Roselind Messier 06/03/2020,4:16 PM        Note Details  Author Roselind Messier, MD File Time 06/03/2020 4:27 PM  Author Type Physician Status Signed  Last Editor Roselind Messier, MD Service Psychiatry  Hospital Acct # [id number] Admit Date 06/02/2020           Note Details  Author Roselind Messier, MD File Time 06/03/2020 4:33 PM  Author Type Physician Status Signed  Last Editor Roselind Messier, MD Service Psychiatry  Hospital Acct # [id number] Admit Date 06/02/2020

## 2020-06-03 NOTE — BH Assessment (Signed)
Per Dr. Assunta Gambles request TTS provided Nicole Cella, RN shelter and OPT resources for pt to follow up upon discharge.

## 2020-06-03 NOTE — Consult Note (Signed)
[facility] Face-to-Face Psychiatry Consult   Reason for Consult:  Dual diagnosis detox from cocaine, benzos and ETOH  History of bipolar disorder with psychosis  Homeless for two years---- Referring Physician:   Patient Identification: Carly Drake MRN:  [id number] Principal Diagnosis: Cocaine-induced mood disorder (HCC) Diagnosis:  Principal Problem:   Cocaine-induced mood disorder (HCC) Active Problems:   Cocaine use disorder, moderate, dependence (HCC)   Total Time spent with patient:   30 min or so  Subjective:  And HPI  Carly Drake is a 34 y.o. female patient admitted with  Dual diagnosis.  She is noncompliant on meds, homeless for two years in dysfunctional regressed state with no structure direction goals.  She has a 36 year old and two year old and lost custody to Dad.  Does not seem to have motivation for serious work in recovery  Hospitalized one year ago in [place] for similar issues has not kept up with outpatient care   Feels overwhelmed on streets but does nothing to improve her situation ----and does not follow up with med clinic   She says she has ETOH intox shakes and tremors ---but ETOH level less than 10       Past Psychiatric History:  Two admissions prior no clinic regular follow up  Risk to Self: Suicidal Ideation: Yes-Currently Present Suicidal Intent: No Is patient at risk for suicide?: Yes Suicidal Plan?: Yes-Currently Present Specify Current Suicidal Plan: Cut her wrist Access to Means: Yes What has been your use of drugs/alcohol within the last 12 months?: Alcohol & Cocaine How many times?: 4 Other Self Harm Risks: Active Substance use Triggers for Past Attempts: Other personal contacts, Other (Comment) Intentional Self Injurious Behavior: Cutting Comment - Self Injurious Behavior: History of cutting Risk to Others: Homicidal Ideation: No Thoughts of Harm to Others: No Current Homicidal Intent: No Current Homicidal Plan: No Access to  Homicidal Means: No Identified Victim: Reports of none History of harm to others?: No Assessment of Violence: None Noted Violent Behavior Description: Reports of none Does patient have access to weapons?: No Criminal Charges Pending?: No Does patient have a court date: No Prior Inpatient Therapy: Prior Inpatient Therapy: Yes Prior Therapy Dates: 02/2016, 01/2016 & 05/2013 Prior Therapy Facilty/Provider(s): [facility] Reason for Treatment: Depression and Substance Abuse Disorder Prior Outpatient Therapy: Prior Outpatient Therapy: No Does patient have Intensive In-House Services?  : No Does patient have [facility] services? : No Does patient have P4CC services?: No  Past Medical History:  Past Medical History:  Diagnosis Date  . Anxiety   . Bipolar disorder (HCC)   . Depression   . Heart murmur since birth  . Paranoid schizophrenia (HCC)   . Pilonidal cyst     Past Surgical History:  Procedure Laterality Date  . CESAREAN SECTION  2007   1x   Family History:  Family History  Problem Relation Age of Onset  . Alcohol abuse Mother   . Drug abuse Mother   . Alcohol abuse Father    Family Psychiatric  History:  She is not aware  Estranged from family Social History:  Social History   Substance and Sexual Activity  Alcohol Use Yes   Comment: daily until intoxicated     Social History   Substance and Sexual Activity  Drug Use Yes  . Types: Cocaine, "Crack" cocaine   Comment: last used 01/27/19    Social History   Socioeconomic History  . Marital status: Married  Spouse name: Not on file  . Number of children: Not on file  . Years of education: Not on file  . Highest education level: Not on file  Occupational History  . Not on file  Tobacco Use  . Smoking status: Current Every Day Smoker    Packs/day: 1.00    Years: 19.00    Pack years: 19.00    Types: Cigarettes  . Smokeless tobacco: Never Used  Vaping Use  . Vaping Use: Never used   Substance and Sexual Activity  . Alcohol use: Yes    Comment: daily until intoxicated  . Drug use: Yes    Types: Cocaine, "Crack" cocaine    Comment: last used 01/27/19  . Sexual activity: Yes    Birth control/protection: None  Other Topics Concern  . Not on file  Social History Narrative  . Not on file   Social Determinants of Health   Financial Resource Strain:   . Difficulty of Paying Living Expenses: Not on file  Food Insecurity:   . Worried About Programme researcher, broadcasting/film/video in the Last Year: Not on file  . Ran Out of Food in the Last Year: Not on file  Transportation Needs:   . Lack of Transportation (Medical): Not on file  . Lack of Transportation (Non-Medical): Not on file  Physical Activity:   . Days of Exercise per Week: Not on file  . Minutes of Exercise per Session: Not on file  Stress:   . Feeling of Stress : Not on file  Social Connections:   . Frequency of Communication with Friends and Family: Not on file  . Frequency of Social Gatherings with Friends and Family: Not on file  . Attends Religious Services: Not on file  . Active Member of Clubs or Organizations: Not on file  . Attends Banker Meetings: Not on file  . Marital Status: Not on file   Additional Social History:  None     Allergies:   Allergies  Allergen Reactions  . Clindamycin/Lincomycin Nausea And Vomiting  . Other Nausea And Vomiting    NO ONIONS OR PEPPERS  Unknown antibiotic    Labs: No results found for this or any previous visit (from the past 48 hour(s)).  Current Facility-Administered Medications  Medication Dose Route Frequency Provider Last Rate Last Admin  . 0.9 %  sodium chloride infusion   Intravenous PRN Arnaldo Natal, MD      . albuterol (VENTOLIN HFA) 108 (90 Base) MCG/ACT inhaler 2 puff  2 puff Inhalation Once PRN Arnaldo Natal, MD      . diphenhydrAMINE (BENADRYL) injection 50 mg  50 mg Intravenous Once PRN Arnaldo Natal, MD      . EPINEPHrine (EPI-PEN)  injection 0.3 mg  0.3 mg Intramuscular Once PRN Arnaldo Natal, MD      . famotidine (PEPCID) IVPB 20 mg premix  20 mg Intravenous Once PRN Arnaldo Natal, MD      . methylPREDNISolone sodium succinate (SOLU-MEDROL) 125 mg/2 mL injection 125 mg  125 mg Intravenous Once PRN Arnaldo Natal, MD      . OLANZapine (ZYPREXA) tablet 5 mg  5 mg Oral QHS Charm Rings, NP   5 mg at 06/02/20 2134  . sertraline (ZOLOFT) tablet 50 mg  50 mg Oral Daily Roselind Messier, MD   50 mg at 06/02/20 1054  . thiamine tablet 100 mg  100 mg Oral Daily Willy Eddy, MD   100 mg at  06/02/20 1054   Or  . thiamine (B-1) injection 100 mg  100 mg Intravenous Daily Willy Eddy, MD       No current outpatient medications on file.    Musculoskeletal: Strength & Muscle Tone: none  Gait & Station: normal  Patient leans: normal   Psychiatric Specialty Exam: Physical Exam  Review of Systems  Blood pressure 109/67, pulse 60, temperature 98.5 F (36.9 C), resp. rate 20, height 5\' 4"  (1.626 m), weight 65.8 kg, last menstrual period 05/30/2020, SpO2 97 %.Body mass index is 24.89 kg/m.    Mental Status   Oriented to person place and time  Consciousness not clouded or fluctuant Concentration and attention fair  Mood and affect depressed Says she has slight shakes and tremors No frank SI or HI or plans Contracts for safety  Memory no new change Fund of knowledge judgement insight reliability fair to poor Intelligence below average Thought content and p rocess addictive themes No frank psychosis 06/01/2020  Abstraction poor  Speech low tone volume  Rapport fair to poor Eye contact poor Wraps Self in blanket   Seeks detox  --worried ---  About ETOH withdrawal                                                   Handedness --none Akathisia none Recall  Fair  Assets not known ADL's--homelesss  Language English  Psychomotor slow  Cognition fair  Sleep erratic Aims not don  e  Treatment Plan Summary:  CIWA     Protocol and check how she is this afternoon   Otherwise ----will then discharge this afternoon   After starting home meds too  Will need clinic referral but she has a pattern of not following through   Disposition:  Shelter after stability without formal Psych admission   Contracts for safety  [place], MD 06/03/2020 10:43 AM

## 2020-06-03 NOTE — ED Notes (Signed)
Pt denies SI/HI/VH on assessment. Pt reports "I'm always hearing voices" when asked about AH.

## 2020-08-18 ENCOUNTER — Encounter: Payer: Self-pay | Admitting: Emergency Medicine

## 2020-08-18 ENCOUNTER — Other Ambulatory Visit: Payer: Self-pay

## 2020-08-18 ENCOUNTER — Emergency Department
Admission: EM | Admit: 2020-08-18 | Discharge: 2020-08-18 | Disposition: A | Discharge status: Home / Self Care | Payer: Self-pay | Attending: Emergency Medicine | Admitting: Emergency Medicine

## 2020-08-18 DIAGNOSIS — F1910 Other psychoactive substance abuse, uncomplicated: Secondary | ICD-10-CM | POA: Insufficient documentation

## 2020-08-18 DIAGNOSIS — E876 Hypokalemia: Secondary | ICD-10-CM | POA: Insufficient documentation

## 2020-08-18 DIAGNOSIS — F200 Paranoid schizophrenia: Secondary | ICD-10-CM | POA: Insufficient documentation

## 2020-08-18 DIAGNOSIS — F319 Bipolar disorder, unspecified: Secondary | ICD-10-CM | POA: Insufficient documentation

## 2020-08-18 DIAGNOSIS — Z79899 Other long term (current) drug therapy: Secondary | ICD-10-CM | POA: Insufficient documentation

## 2020-08-18 DIAGNOSIS — F17210 Nicotine dependence, cigarettes, uncomplicated: Secondary | ICD-10-CM | POA: Insufficient documentation

## 2020-08-18 LAB — URINE DRUG SCREEN, QUALITATIVE (ARMC ONLY)
Amphetamines, Ur Screen: NOT DETECTED
Barbiturates, Ur Screen: NOT DETECTED
Benzodiazepine, Ur Scrn: NOT DETECTED
Cannabinoid 50 Ng, Ur ~~LOC~~: POSITIVE — AB
Cocaine Metabolite,Ur ~~LOC~~: POSITIVE — AB
MDMA (Ecstasy)Ur Screen: NOT DETECTED
Methadone Scn, Ur: POSITIVE — AB
Opiate, Ur Screen: NOT DETECTED
Phencyclidine (PCP) Ur S: NOT DETECTED
Tricyclic, Ur Screen: NOT DETECTED

## 2020-08-18 LAB — CBC WITH DIFFERENTIAL/PLATELET
Abs Immature Granulocytes: 0.02 10*3/uL (ref 0.00–0.07)
Basophils Absolute: 0 10*3/uL (ref 0.0–0.1)
Basophils Relative: 0 %
Eosinophils Absolute: 0.1 10*3/uL (ref 0.0–0.5)
Eosinophils Relative: 1 %
HCT: 41 % (ref 36.0–46.0)
Hemoglobin: 13.5 g/dL (ref 12.0–15.0)
Immature Granulocytes: 0 %
Lymphocytes Relative: 19 %
Lymphs Abs: 1.5 10*3/uL (ref 0.7–4.0)
MCH: 30.4 pg (ref 26.0–34.0)
MCHC: 32.9 g/dL (ref 30.0–36.0)
MCV: 92.3 fL (ref 80.0–100.0)
Monocytes Absolute: 0.6 10*3/uL (ref 0.1–1.0)
Monocytes Relative: 8 %
Neutro Abs: 5.6 10*3/uL (ref 1.7–7.7)
Neutrophils Relative %: 72 %
Platelets: 297 10*3/uL (ref 150–400)
RBC: 4.44 MIL/uL (ref 3.87–5.11)
RDW: 14 % (ref 11.5–15.5)
WBC: 7.7 10*3/uL (ref 4.0–10.5)
nRBC: 0 % (ref 0.0–0.2)

## 2020-08-18 LAB — COMPREHENSIVE METABOLIC PANEL
ALT: 12 U/L (ref 0–44)
AST: 21 U/L (ref 15–41)
Albumin: 3.5 g/dL (ref 3.5–5.0)
Alkaline Phosphatase: 67 U/L (ref 38–126)
Anion gap: 8 (ref 5–15)
BUN: 16 mg/dL (ref 6–20)
CO2: 26 mmol/L (ref 22–32)
Calcium: 8.5 mg/dL — ABNORMAL LOW (ref 8.9–10.3)
Chloride: 103 mmol/L (ref 98–111)
Creatinine, Ser: 0.85 mg/dL (ref 0.44–1.00)
GFR, Estimated: >60 mL/min (ref >60)
Glucose, Bld: 91 mg/dL (ref 70–99)
Potassium: 3.2 mmol/L — ABNORMAL LOW (ref 3.5–5.1)
Sodium: 137 mmol/L (ref 135–145)
Total Bilirubin: 0.6 mg/dL (ref 0.3–1.2)
Total Protein: 7.8 g/dL (ref 6.5–8.1)

## 2020-08-18 LAB — HCG, QUANTITATIVE, PREGNANCY: hCG, Beta Chain, Quant, S: <1 m[IU]/mL (ref <5)

## 2020-08-18 LAB — SALICYLATE LEVEL: Salicylate Lvl: <7 mg/dL — ABNORMAL LOW (ref 7.0–30.0)

## 2020-08-18 LAB — ETHANOL: Alcohol, Ethyl (B): <10 mg/dL (ref <10)

## 2020-08-18 LAB — ACETAMINOPHEN LEVEL: Acetaminophen (Tylenol), Serum: <10 ug/mL — ABNORMAL LOW (ref 10–30)

## 2020-08-18 NOTE — ED Notes (Signed)
Pt still currently sleeping at this time. BP cuff and pulse ox still monitoring pt.

## 2020-08-18 NOTE — ED Notes (Signed)
Called RT to run VBG at this time.

## 2020-08-18 NOTE — ED Triage Notes (Signed)
Pt via EMS from home. Per EMS, pt took and unknown amount of gabapentin and klonopin around 1230 today. Not prescribed to her. On assessment, pt is responsive to pain but otherwise very sleepy on arrival. VSS. NAD noted.

## 2020-08-18 NOTE — ED Provider Notes (Signed)
-----------------------------------------   8:17 PM on 08/18/2020 -----------------------------------------  Patient is now awake and alert.  She is eating.  Has no complaints at this time.  She did state she was concerned that she could possibly be pregnant and did request a pregnancy test.  I have added on a quantitative beta-hCG test that has resulted negative.  The patient is now awake and alert denies any SI or HI we will discharge from the emergency department with outpatient resources for substance abuse.  Patient agreeable to plan of care.   Minna Antis, MD 08/18/20 2018

## 2020-08-18 NOTE — ED Provider Notes (Signed)
[facility] Emergency Department Provider Note  ____________________________________________   Event Date/Time   First MD Initiated Contact with Patient 08/18/20 1343     (approximate)  I have reviewed the triage vital signs and the nursing notes.   HISTORY  Chief Complaint Drug Overdose    HPI Carly Drake is a 34 y.o. female with history of bipolar disorder, schizophrenia, chronic substance abuse, here with altered mental status.  Per report, the patient took a friend's gabapentin and Klonopin.  She has since been very drowsy.  She had difficulty being aroused so friends called EMS.  Per report, this was not intentional although patient is unable to provide much history.  On arrival, patient somnolent, limiting history.  Level 5 caveat invoked as remainder of history, ROS, and physical exam limited due to patient's AMS.         Past Medical History:  Diagnosis Date  . Anxiety   . Bipolar disorder (HCC)   . Depression   . Heart murmur since birth  . Paranoid schizophrenia (HCC)   . Pilonidal cyst     Patient Active Problem List   Diagnosis Date Noted  . 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 PTSD (post-traumatic stress disorder) 03/24/2016  . Cellulitis 02/11/2016  . Alcohol use disorder, moderate, dependence (HCC) 02/11/2016  . Cocaine use disorder, moderate, dependence (HCC) 02/11/2016  . Tobacco use disorder 02/11/2016    Past Surgical History:  Procedure Laterality Date  . CESAREAN SECTION  2007   1x    Prior to Admission medications   Medication Sig Start Date End Date Taking? Authorizing Provider  citalopram (CELEXA) 20 MG tablet Take 1 tablet (20 mg total) by mouth daily. 06/03/20 06/03/21  Gilles Chiquito, MD  thiothixene (NAVANE) 1 MG capsule Take 4 capsules (4 mg total) by mouth 2 (two) times daily. 06/03/20 07/03/20  Gilles Chiquito, MD  trihexyphenidyl (ARTANE) 2 MG tablet Take 0.5 tablets (1 mg total) by mouth  2 (two) times daily with a meal. 06/03/20   Gilles Chiquito, MD    Allergies Clindamycin/lincomycin and Other  Family History  Problem Relation Age of Onset  . Alcohol abuse Mother   . Drug abuse Mother   . Alcohol abuse Father     Social History Social History   Tobacco Use  . Smoking status: Current Every Day Smoker    Packs/day: 1.00    Years: 19.00    Pack years: 19.00    Types: Cigarettes  . Smokeless tobacco: Never Used  Vaping Use  . Vaping Use: Never used  Substance Use Topics  . Alcohol use: Yes    Comment: daily until intoxicated  . Drug use: Yes    Types: Cocaine, "Crack" cocaine    Comment: last used 01/27/19    Review of Systems  Review of Systems  Unable to perform ROS: Mental status change     ____________________________________________  PHYSICAL EXAM:      VITAL SIGNS: ED Triage Vitals [08/18/20 1342]  Enc Vitals Group     BP (!) 110/52     Pulse Rate 71     Resp 18     Temp 97.7 F (36.5 C)     Temp Source Axillary     SpO2 98 %     Weight 160 lb (72.6 kg)     Height 5\' 8"  (1.727 m)     Head Circumference      Peak Flow  Pain Score Asleep     Pain Loc      Pain Edu?      Excl. in GC?      Physical Exam Vitals and nursing note reviewed.  Constitutional:      General: She is not in acute distress.    Appearance: She is well-developed and well-nourished.  HENT:     Head: Normocephalic and atraumatic.  Eyes:     Conjunctiva/sclera: Conjunctivae normal.  Cardiovascular:     Rate and Rhythm: Normal rate and regular rhythm.     Heart sounds: Normal heart sounds.  Pulmonary:     Effort: Pulmonary effort is normal. No respiratory distress.     Breath sounds: No wheezing.  Abdominal:     General: There is no distension.  Musculoskeletal:        General: No edema.     Cervical back: Neck supple.  Skin:    General: Skin is warm.     Capillary Refill: Capillary refill takes less than 2 seconds.     Findings: No rash.   Neurological:     Mental Status: She is alert and oriented to person, place, and time.     GCS: GCS eye subscore is 2. GCS verbal subscore is 4. GCS motor subscore is 5.     Motor: No abnormal muscle tone.     Comments: Moans, responds to sternal rub. Noted to move all extremities. Face is grossly symmetric. Speech slurred.       ____________________________________________   LABS (all labs ordered are listed, but only abnormal results are displayed)  Labs Reviewed  COMPREHENSIVE METABOLIC PANEL - Abnormal; Notable for the following components:      Result Value   Potassium 3.2 (*)    Calcium 8.5 (*)    All other components within normal limits  ACETAMINOPHEN LEVEL - Abnormal; Notable for the following components:   Acetaminophen (Tylenol), Serum <10 (*)    All other components within normal limits  SALICYLATE LEVEL - Abnormal; Notable for the following components:   Salicylate Lvl <7.0 (*)    All other components within normal limits  BLOOD GAS, VENOUS - Abnormal; Notable for the following components:   Bicarbonate 31.1 (*)    Acid-Base Excess 4.2 (*)    All other components within normal limits  CBC WITH DIFFERENTIAL/PLATELET  ETHANOL  URINE DRUG SCREEN, QUALITATIVE ([facility] ONLY)    ____________________________________________  EKG:  ________________________________________  RADIOLOGY All imaging, including plain films, CT scans, and ultrasounds, independently reviewed by me, and interpretations confirmed via formal radiology reads.  ED MD interpretation:     Official radiology report(s): No results found.  ____________________________________________  PROCEDURES   Procedure(s) performed (including Critical Care):  Procedures  ____________________________________________  INITIAL IMPRESSION / MDM / ASSESSMENT AND PLAN / ED COURSE  As part of my medical decision making, I reviewed the following data within the electronic MEDICAL RECORD NUMBER Nursing notes  reviewed and incorporated, Old chart reviewed, Notes from prior ED visits, and [place] Controlled Substance Database       *Aquinnah Armand was evaluated in Emergency Department on 08/18/2020 for the symptoms described in the history of present illness. She was evaluated in the context of the global COVID-19 pandemic, which necessitated consideration that the patient might be at risk for infection with the SARS-CoV-2 virus that causes COVID-19. Institutional protocols and algorithms that pertain to the evaluation of patients at risk for COVID-19 are in a state of rapid change based on information released  by regulatory bodies including the CDC and federal and state organizations. These policies and algorithms were followed during the patient's care in the ED.  Some ED evaluations and interventions may be delayed as a result of limited staffing during the pandemic.*     Medical Decision Making:  34 yo F here with polysubstance abuse and intoxication. Blood work is overall very reassuring. VBG without signs of hypercapnia. CBC without leukocytosis. CMP with only mild hypokalemia. UDS is pending. EtOH, apap, salicylate negative. No signs of head trauma. Will allow pt to metabolize then reassess.  ____________________________________________  FINAL CLINICAL IMPRESSION(S) / ED DIAGNOSES  Final diagnoses:  Polysubstance abuse (HCC)     MEDICATIONS GIVEN DURING THIS VISIT:  Medications - No data to display   ED Discharge Orders    None       Note:  This document was prepared using Dragon voice recognition software and may include unintentional dictation errors.   Shaune Pollack, MD 08/18/20 [phone]

## 2020-08-18 NOTE — ED Notes (Signed)
Pt awake at this time. Pt states that she doesn't know why she is here and what is going on. This RN

## 2020-10-16 LAB — BLOOD GAS, VENOUS
Acid-Base Excess: 4.2 mmol/L — ABNORMAL HIGH (ref 0.0–2.0)
Bicarbonate: 31.1 mmol/L — ABNORMAL HIGH (ref 20.0–28.0)
O2 Saturation: 37.1 %
Patient temperature: 37
pCO2, Ven: 55 mmHg (ref 44.0–60.0)
pH, Ven: 7.36 (ref 7.250–7.430)

## 2021-01-30 IMAGING — CR DG CHEST 2V
1 series · 2 of 2 positions shown · non-contrast
Comparison: 05/13/2015

CLINICAL DATA: Shortness of breath with cough and fever.

EXAM:
CHEST - 2 VIEW

[Series 1: w chest pa · 0.14mm/px · 2 of 2 slices shown]
[im 1/2]
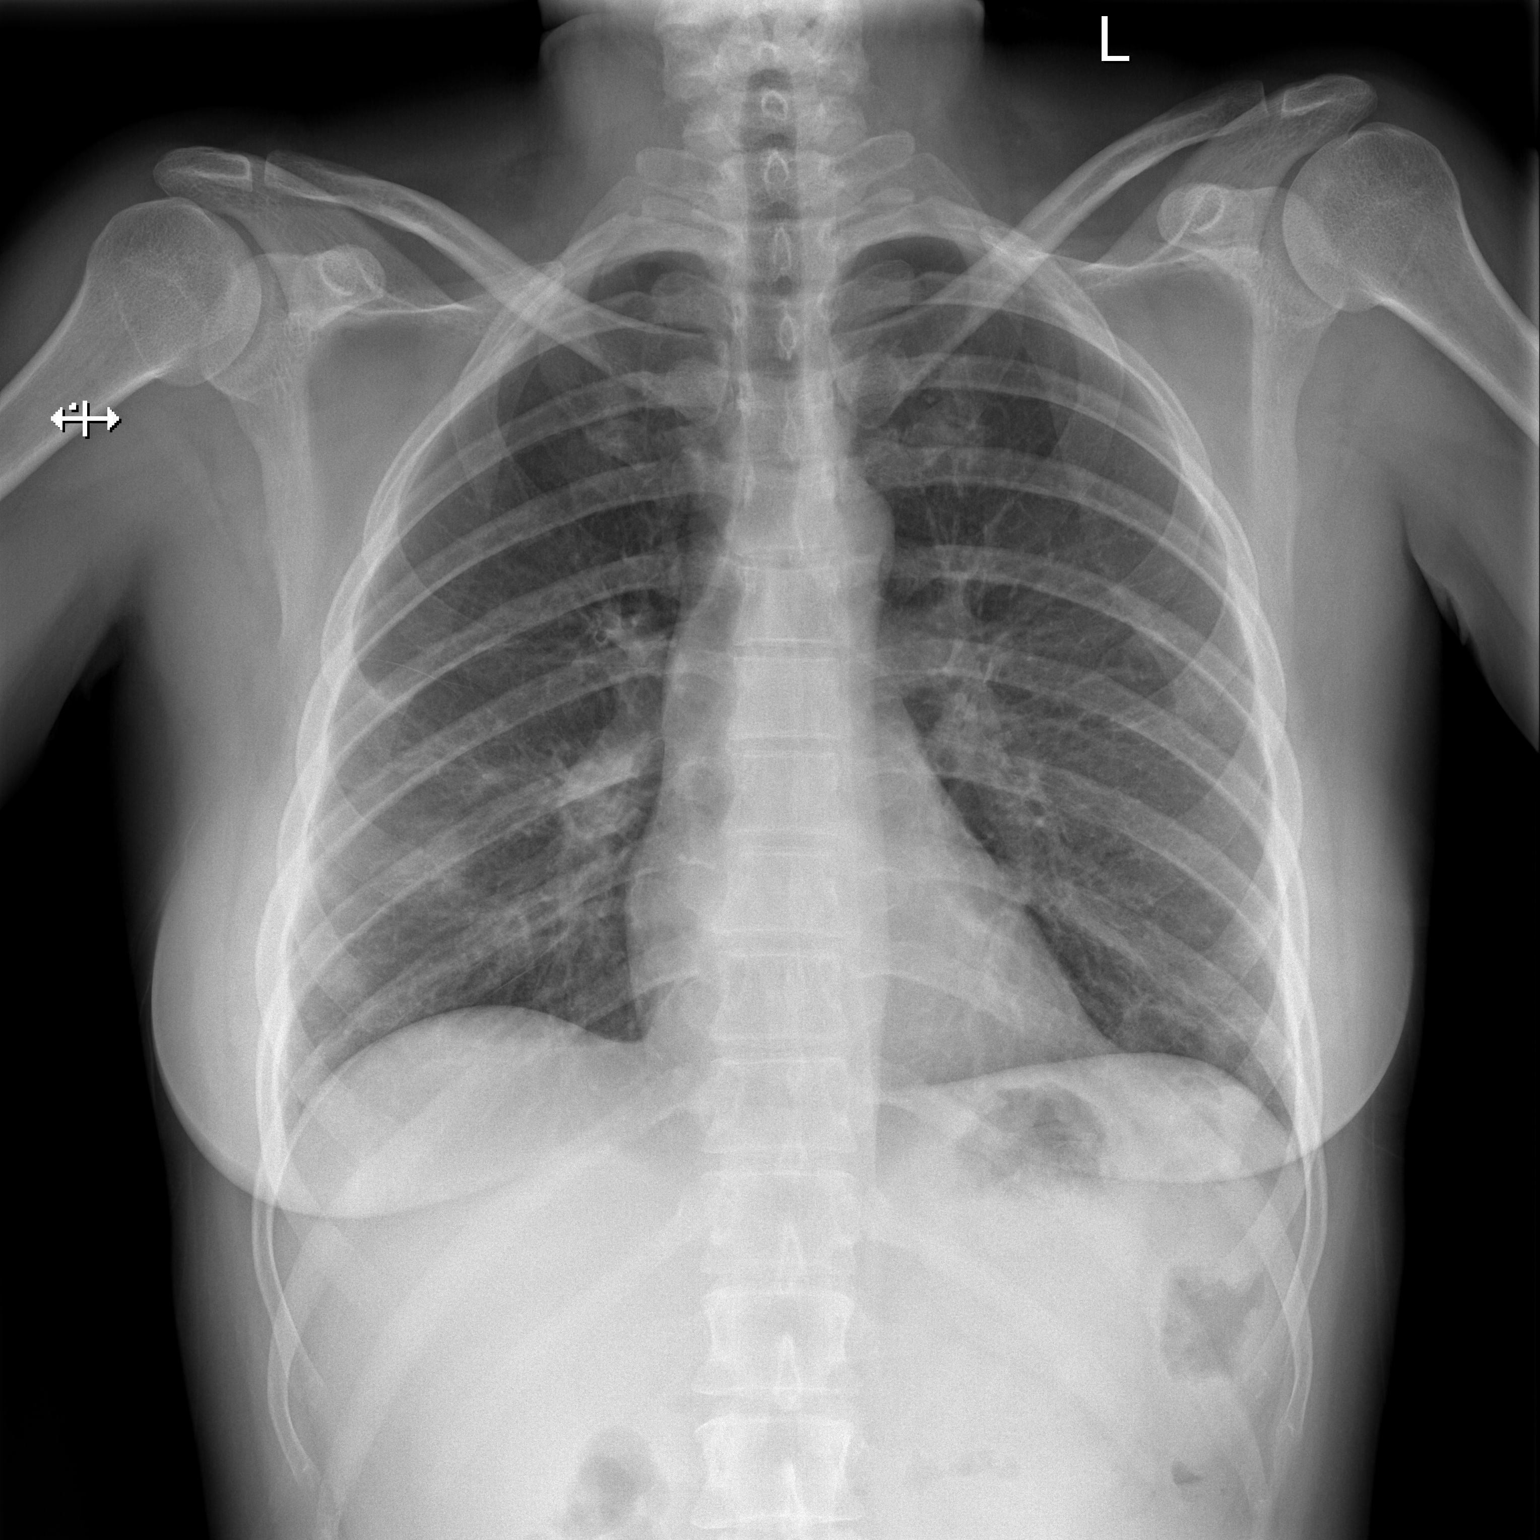
[im 2/2]
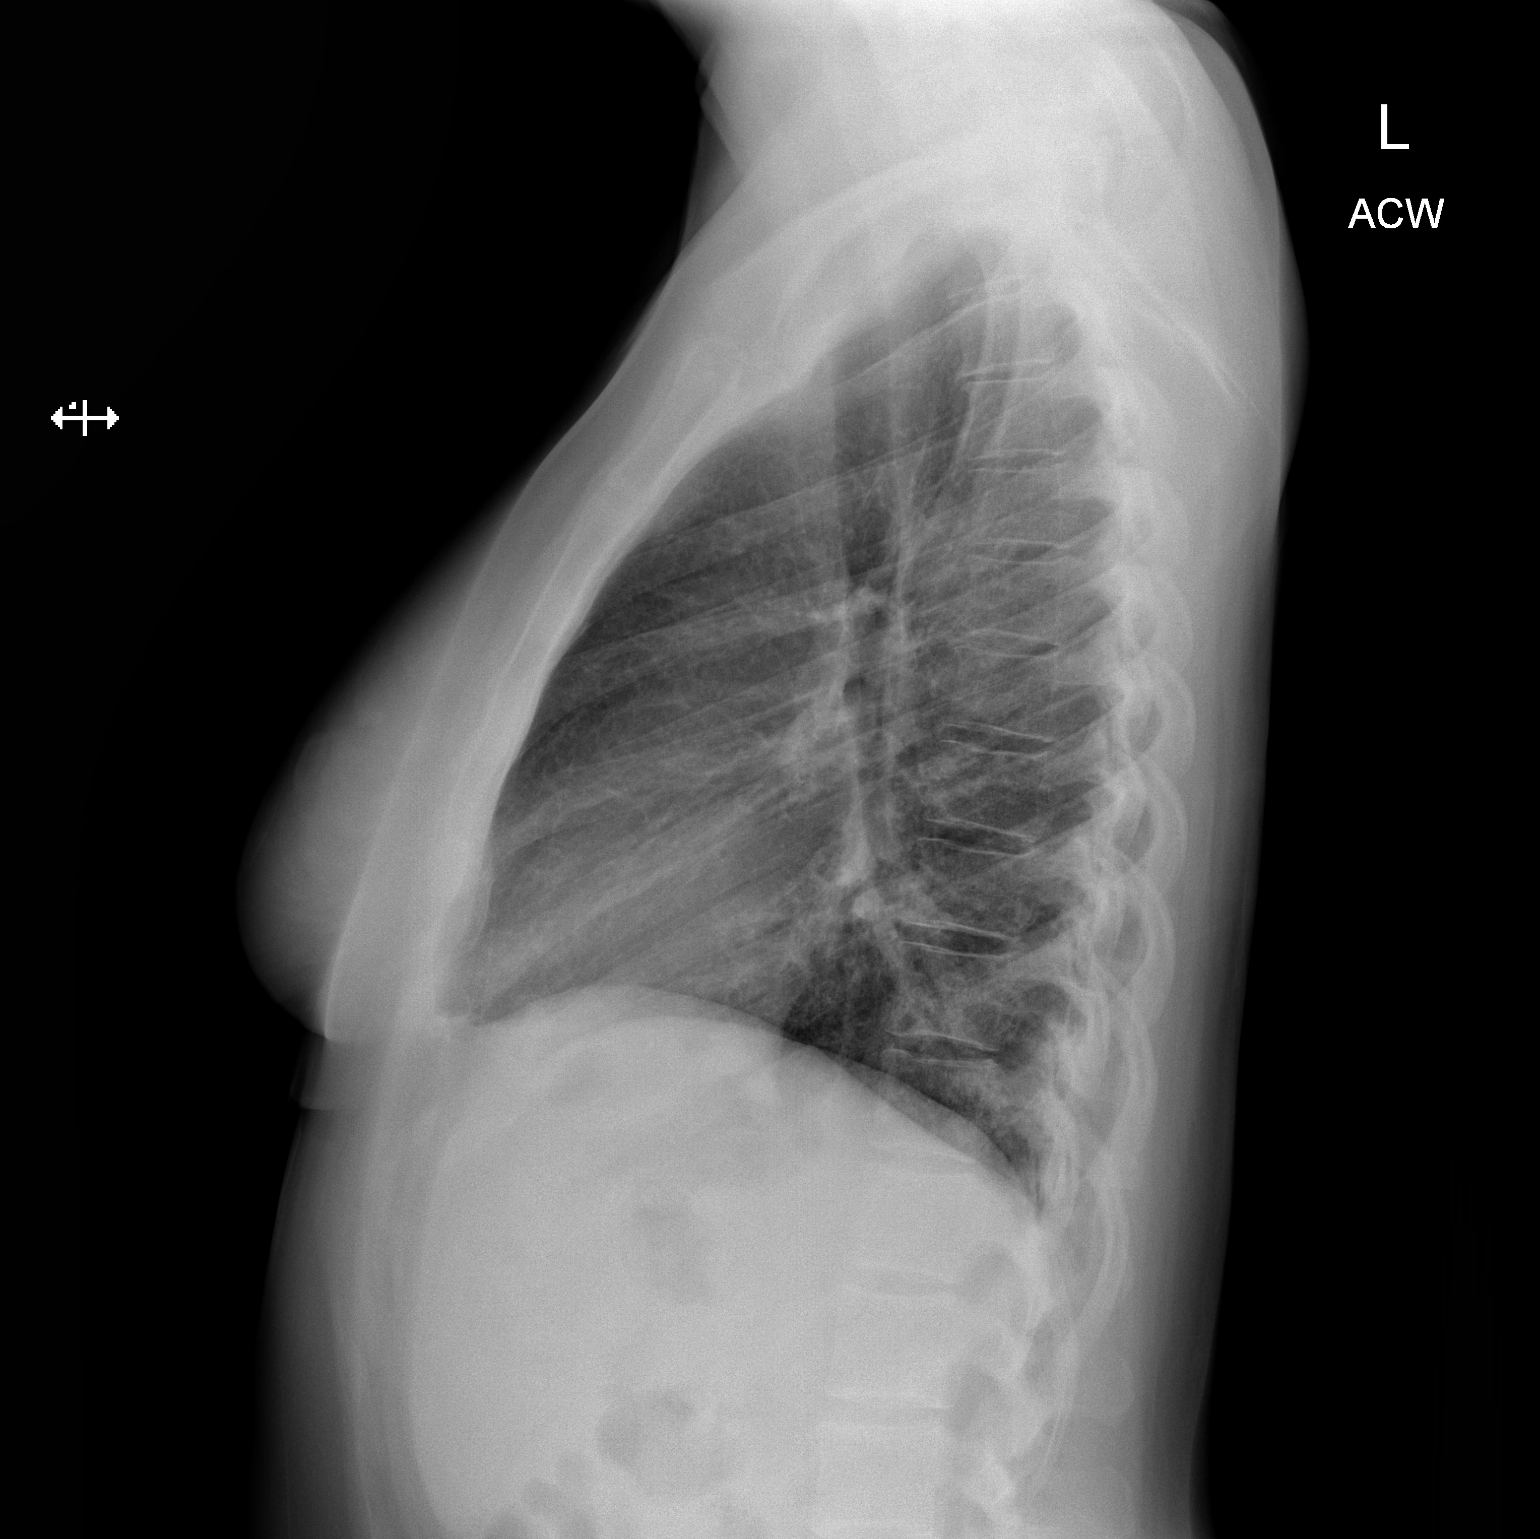

[2 of 2 positions shown; findings below may reference images not displayed]

FINDINGS: Patchy areas of airspace opacity are identified in both lung bases,
right greater than left. No pulmonary edema or pleural effusion. No
pneumothorax. The cardiopericardial silhouette is within normal
limits for size. The visualized bony structures of the thorax show
no acute abnormality.
IMPRESSION: Patchy airspace opacity in both lung bases, right greater than left.
Imaging features compatible with multifocal pneumonia.
